# Patient Record
Sex: Male | Born: 1960 | Race: White | Hispanic: No | Marital: Married | Smoking: Never smoker
Health system: Southern US, Community
[De-identification: ages and names within clinical notes are randomized; demographics above are authoritative.]

## PROBLEM LIST (undated history)

## (undated) DIAGNOSIS — G4730 Sleep apnea, unspecified: Secondary | ICD-10-CM

## (undated) DIAGNOSIS — N419 Inflammatory disease of prostate, unspecified: Secondary | ICD-10-CM

## (undated) DIAGNOSIS — K635 Polyp of colon: Secondary | ICD-10-CM

## (undated) DIAGNOSIS — K219 Gastro-esophageal reflux disease without esophagitis: Secondary | ICD-10-CM

## (undated) DIAGNOSIS — M1990 Unspecified osteoarthritis, unspecified site: Secondary | ICD-10-CM

## (undated) DIAGNOSIS — G2581 Restless legs syndrome: Secondary | ICD-10-CM

## (undated) DIAGNOSIS — E079 Disorder of thyroid, unspecified: Secondary | ICD-10-CM

## (undated) HISTORY — DX: Unspecified osteoarthritis, unspecified site: M19.90

## (undated) HISTORY — DX: Gastro-esophageal reflux disease without esophagitis: K21.9

## (undated) HISTORY — DX: Sleep apnea, unspecified: G47.30

## (undated) HISTORY — DX: Polyp of colon: K63.5

## (undated) HISTORY — DX: Disorder of thyroid, unspecified: E07.9

## (undated) HISTORY — PX: CHOLECYSTECTOMY: SHX55

---

## 1999-11-04 ENCOUNTER — Other Ambulatory Visit: Admission: RE | Admit: 1999-11-04 | Discharge: 1999-11-04 | Payer: Self-pay | Admitting: *Deleted

## 2016-09-28 DIAGNOSIS — R079 Chest pain, unspecified: Secondary | ICD-10-CM | POA: Diagnosis not present

## 2016-09-28 DIAGNOSIS — G4733 Obstructive sleep apnea (adult) (pediatric): Secondary | ICD-10-CM | POA: Diagnosis not present

## 2016-09-28 DIAGNOSIS — I10 Essential (primary) hypertension: Secondary | ICD-10-CM | POA: Diagnosis not present

## 2016-09-28 DIAGNOSIS — G2581 Restless legs syndrome: Secondary | ICD-10-CM

## 2016-09-28 DIAGNOSIS — N400 Enlarged prostate without lower urinary tract symptoms: Secondary | ICD-10-CM

## 2016-09-29 DIAGNOSIS — I10 Essential (primary) hypertension: Secondary | ICD-10-CM | POA: Diagnosis not present

## 2016-09-29 DIAGNOSIS — G4733 Obstructive sleep apnea (adult) (pediatric): Secondary | ICD-10-CM | POA: Diagnosis not present

## 2016-09-29 DIAGNOSIS — R079 Chest pain, unspecified: Secondary | ICD-10-CM | POA: Diagnosis not present

## 2016-09-29 DIAGNOSIS — N400 Enlarged prostate without lower urinary tract symptoms: Secondary | ICD-10-CM | POA: Diagnosis not present

## 2018-03-02 ENCOUNTER — Emergency Department (HOSPITAL_BASED_OUTPATIENT_CLINIC_OR_DEPARTMENT_OTHER)
Admission: EM | Admit: 2018-03-02 | Discharge: 2018-03-02 | Disposition: A | Discharge status: Home / Self Care | Payer: BLUE CROSS/BLUE SHIELD | Attending: Emergency Medicine | Admitting: Emergency Medicine

## 2018-03-02 ENCOUNTER — Other Ambulatory Visit: Payer: Self-pay

## 2018-03-02 ENCOUNTER — Encounter (HOSPITAL_BASED_OUTPATIENT_CLINIC_OR_DEPARTMENT_OTHER): Payer: Self-pay

## 2018-03-02 DIAGNOSIS — N4822 Cellulitis of corpus cavernosum and penis: Secondary | ICD-10-CM

## 2018-03-02 DIAGNOSIS — Z79899 Other long term (current) drug therapy: Secondary | ICD-10-CM | POA: Insufficient documentation

## 2018-03-02 HISTORY — DX: Inflammatory disease of prostate, unspecified: N41.9

## 2018-03-02 HISTORY — DX: Restless legs syndrome: G25.81

## 2018-03-02 MED ORDER — CEPHALEXIN 500 MG PO CAPS: 500.0000 mg | ORAL_CAPSULE | Freq: Four times a day (QID) | ORAL | 0 refills | Status: AC

## 2018-03-02 NOTE — ED Triage Notes (Signed)
Pt c/o swelling to the tip of his penis, went to UC and sent here; denies discharge or urinary difficulty

## 2018-03-02 NOTE — ED Provider Notes (Signed)
[facility] Provider Note   CSN: [id number] Arrival date & time: 03/02/18  1707     History   Chief Complaint Chief Complaint  Patient presents with  . Groin Swelling    HPI Christopher [name] is a 57 y.o. male.  The history is provided by the patient and medical records.  Rash   This is a new problem. The current episode started 12 to 24 hours ago. The problem has not changed since onset.The problem is associated with nothing. There has been no fever. The rash is present on the genitalia. The pain is at a severity of 1/10. The pain is mild. The pain has been constant since onset. Associated symptoms include pain. He has tried nothing for the symptoms. The treatment provided no relief.    Past Medical History:  Diagnosis Date  . Prostatitis   . Restless leg     There are no active problems to display for this patient.   History reviewed. No pertinent surgical history.      Home Medications    Prior to Admission medications   Medication Sig Start Date End Date Taking? Authorizing Provider  gabapentin (NEURONTIN) 600 MG tablet Take 600 mg by mouth at bedtime.   Yes [provider]  tamsulosin (FLOMAX) 0.4 MG CAPS capsule Take 0.4 mg by mouth.   Yes [provider]    Family History No family history on file.  Social History Social History   Tobacco Use  . Smoking status: Never Smoker  . Smokeless tobacco: Never Used  Substance Use Topics  . Alcohol use: Yes  . Drug use: Not Currently     Allergies   Patient has no known allergies.   Review of Systems Review of Systems  Constitutional: Negative for activity change, chills, diaphoresis, fatigue and fever.  HENT: Negative for congestion and rhinorrhea.   Eyes: Negative for visual disturbance.  Respiratory: Negative for cough, chest tightness, shortness of breath, wheezing and stridor.   Cardiovascular: Negative for chest pain, palpitations and leg  swelling.  Gastrointestinal: Negative for abdominal distention, abdominal pain, blood in stool, constipation, diarrhea, nausea and vomiting.  Genitourinary: Positive for penile pain and penile swelling. Negative for decreased urine volume, difficulty urinating, discharge, dysuria, flank pain, frequency, hematuria, scrotal swelling, testicular pain and urgency.  Musculoskeletal: Negative for back pain and gait problem.  Skin: Positive for rash. Negative for wound.  Neurological: Negative for dizziness, weakness, light-headedness and headaches.  Psychiatric/Behavioral: Negative for agitation.  All other systems reviewed and are negative.    Physical Exam Updated Vital Signs BP 140/81 (BP Location: Right Arm)   Pulse 96   Temp 98.5 F (36.9 C) (Oral)   Resp 18   Ht 6' (1.829 m)   Wt 112 kg (247 lb)   SpO2 97%   BMI 33.50 kg/m   Physical Exam  Constitutional: He is oriented to person, place, and time. He appears well-developed and well-nourished. No distress.  HENT:  Head: Normocephalic and atraumatic.  Eyes: Conjunctivae are normal.  Neck: Neck supple.  Cardiovascular: Normal rate.  Pulmonary/Chest: Effort normal.  Abdominal: Soft. There is no tenderness. Hernia confirmed negative in the right inguinal area and confirmed negative in the left inguinal area.  Genitourinary: Testes normal. Right testis shows no tenderness. Right testis is descended. Left testis is descended. Circumcised. Penile erythema and penile tenderness present. No phimosis or paraphimosis. No discharge found.     Musculoskeletal: He exhibits no edema or deformity.  Lymphadenopathy: No inguinal adenopathy noted on the right or left side.  Neurological: He is alert and oriented to person, place, and time. No sensory deficit.  Skin: Skin is warm and dry. Capillary refill takes less than 2 seconds. Rash noted. He is not diaphoretic. There is erythema (groin). No pallor.  Psychiatric: He has a normal mood and  affect.  Nursing note and vitals reviewed.    ED Treatments / Results  Labs (all labs ordered are listed, but only abnormal results are displayed) Labs Reviewed - No data to display  EKG None  Radiology No results found.  Procedures Procedures (including critical care time)  Medications Ordered in ED Medications - No data to display   Initial Impression / Assessment and Plan / ED Course  I have reviewed the triage vital signs and the nursing notes.  Pertinent labs & imaging results that were available during my care of the patient were reviewed by me and considered in my medical decision making (see chart for details).     Christopher [name] is a 57 y.o. male with no significant past medical history who presents with penile swelling.  Patient reports that he woke up this morning with some swelling redness and mild tenderness at the distal penis but proximal to the glans.  He denies any dysuria, hematuria or difficulty with urination.  He reports no sexual injuries and has had no cuts or scrapes on his penis.  He denies any history of this.  He said remotely had a history of prostatitis but said he did not have any similar symptoms.  He denies any hernias, testicle pain or testicle injuries.  He denies any fevers, chills, rhinorrhea, congestion, cough, urinary symptoms or GI symptoms.  He denies other complaints.  Next  Patient reports using an urgent care and was sent to the emergency department for evaluation.  He reports that he is never had this before.  On exam, patient had very mild swelling at his distal penis just proximal to the glans.  He is circumcised.  There was some mild tenderness and erythema at this area  No ulcers or vesicles were seen.  No palpable areas of lymph nodes.  No testicle tenderness and patient had normal cremasteric bilaterally.  Testicles nontender and exam otherwise unremarkable.  Suspect penile cellulitis.  Given patient's lack of urinary symptoms  and no testicle tenderness or symptoms, do not feel patient needs ultrasound at this time.  There was not a palpable fluctuant area that needed drainage however we do want to cover with antibiotics.  Patient was started on Keflex and will follow up with a PCP and urologist for further management.  Do not feel patient has balanitis given lack of any abnormalities or tenderness on the glans.  Patient denies history of STI and denied any other complaints.  Next  Patient understood plan of care as well as return precautions and follow-up instructions.  Patient had no other questions or concerns and was discharged in good condition.   Final Clinical Impressions(s) / ED Diagnoses   Final diagnoses:  Penile cellulitis    ED Discharge Orders        Ordered    cephALEXin (KEFLEX) 500 MG capsule  4 times daily     03/02/18 1935      Clinical Impression: 1. Penile cellulitis     Disposition: Discharge  Condition: Good  I have discussed the results, Dx and Tx plan with the pt(& family if present). He/she/they expressed understanding  and agree(s) with the plan. Discharge instructions discussed at great length. Strict return precautions discussed and pt &/or family have verbalized understanding of the instructions. No further questions at time of discharge.    New Prescriptions   CEPHALEXIN (KEFLEX) 500 MG CAPSULE    Take 1 capsule (500 mg total) by mouth 4 (four) times daily for 7 days.    Follow Up: Festus Aloe, MD [place] [address] [phone]     [place] [id number] [phone] Schedule an appointment as soon as possible for a visit       Yehya Brendle, Gwenyth Allegra, MD 03/03/18 [phone]

## 2018-03-02 NOTE — ED Notes (Signed)
Pt cont await md eval.

## 2018-03-02 NOTE — Discharge Instructions (Signed)
Your exam today was consistent with a penile cellulitis.  It did not appear to involve the tip called a balanitis.  As you have no other symptoms and had no tenderness in the groin, scrotum, testicles, or other areas, we do not feel he needed further laboratory testing or imaging/ultrasound today.  We do want to treat you with antibiotics for this infection and have a follow-up with a PCP and urology.  If any symptoms change or worsen or he began having difficulty urinating, please return to the nearest emergency department.

## 2018-07-19 ENCOUNTER — Emergency Department (HOSPITAL_COMMUNITY): Payer: BLUE CROSS/BLUE SHIELD

## 2018-07-19 ENCOUNTER — Encounter (HOSPITAL_COMMUNITY): Payer: Self-pay

## 2018-07-19 ENCOUNTER — Emergency Department (HOSPITAL_COMMUNITY)
Admission: EM | Admit: 2018-07-19 | Discharge: 2018-07-19 | Disposition: A | Discharge status: Home / Self Care | Payer: BLUE CROSS/BLUE SHIELD | Attending: Emergency Medicine | Admitting: Emergency Medicine

## 2018-07-19 DIAGNOSIS — Z79899 Other long term (current) drug therapy: Secondary | ICD-10-CM | POA: Diagnosis not present

## 2018-07-19 DIAGNOSIS — S37012A Minor contusion of left kidney, initial encounter: Secondary | ICD-10-CM | POA: Diagnosis not present

## 2018-07-19 DIAGNOSIS — Y939 Activity, unspecified: Secondary | ICD-10-CM | POA: Insufficient documentation

## 2018-07-19 DIAGNOSIS — T07XXXA Unspecified multiple injuries, initial encounter: Secondary | ICD-10-CM

## 2018-07-19 DIAGNOSIS — F17220 Nicotine dependence, chewing tobacco, uncomplicated: Secondary | ICD-10-CM | POA: Diagnosis not present

## 2018-07-19 DIAGNOSIS — S2232XA Fracture of one rib, left side, initial encounter for closed fracture: Secondary | ICD-10-CM | POA: Diagnosis not present

## 2018-07-19 DIAGNOSIS — Y998 Other external cause status: Secondary | ICD-10-CM | POA: Insufficient documentation

## 2018-07-19 DIAGNOSIS — Y92410 Unspecified street and highway as the place of occurrence of the external cause: Secondary | ICD-10-CM | POA: Insufficient documentation

## 2018-07-19 DIAGNOSIS — S0081XA Abrasion of other part of head, initial encounter: Secondary | ICD-10-CM | POA: Diagnosis present

## 2018-07-19 LAB — COMPREHENSIVE METABOLIC PANEL
ALBUMIN: 4 g/dL (ref 3.5–5.0)
ALT: 41 U/L (ref 0–44)
AST: 34 U/L (ref 15–41)
Alkaline Phosphatase: 61 U/L (ref 38–126)
Anion gap: 10 (ref 5–15)
BILIRUBIN TOTAL: 1.2 mg/dL (ref 0.3–1.2)
BUN: 16 mg/dL (ref 6–20)
CHLORIDE: 102 mmol/L (ref 98–111)
CO2: 24 mmol/L (ref 22–32)
CREATININE: 1.1 mg/dL (ref 0.61–1.24)
Calcium: 9 mg/dL (ref 8.9–10.3)
GFR calc Af Amer: >60 mL/min (ref >60)
GLUCOSE: 136 mg/dL — AB (ref 70–99)
POTASSIUM: 4.1 mmol/L (ref 3.5–5.1)
Sodium: 136 mmol/L (ref 135–145)
Total Protein: 6.9 g/dL (ref 6.5–8.1)

## 2018-07-19 LAB — CBC WITH DIFFERENTIAL/PLATELET
ABS IMMATURE GRANULOCYTES: 0.07 10*3/uL (ref 0.00–0.07)
BASOS ABS: 0 10*3/uL (ref 0.0–0.1)
Basophils Relative: 0 %
Eosinophils Absolute: 0.2 10*3/uL (ref 0.0–0.5)
Eosinophils Relative: 2 %
HEMATOCRIT: 50.3 % (ref 39.0–52.0)
Hemoglobin: 16.8 g/dL (ref 13.0–17.0)
Immature Granulocytes: 1 %
LYMPHS ABS: 1.4 10*3/uL (ref 0.7–4.0)
Lymphocytes Relative: 12 %
MCH: 28.8 pg (ref 26.0–34.0)
MCHC: 33.4 g/dL (ref 30.0–36.0)
MCV: 86.3 fL (ref 80.0–100.0)
MONOS PCT: 6 %
Monocytes Absolute: 0.7 10*3/uL (ref 0.1–1.0)
NEUTROS ABS: 9 10*3/uL — AB (ref 1.7–7.7)
NRBC: 0 % (ref 0.0–0.2)
Neutrophils Relative %: 79 %
PLATELETS: 173 10*3/uL (ref 150–400)
RBC: 5.83 MIL/uL — ABNORMAL HIGH (ref 4.22–5.81)
RDW: 13 % (ref 11.5–15.5)
WBC: 11.3 10*3/uL — ABNORMAL HIGH (ref 4.0–10.5)

## 2018-07-19 LAB — URINALYSIS, ROUTINE W REFLEX MICROSCOPIC
BACTERIA UA: NONE SEEN
Bilirubin Urine: NEGATIVE
Glucose, UA: NEGATIVE mg/dL
Ketones, ur: NEGATIVE mg/dL
Leukocytes, UA: NEGATIVE
Nitrite: NEGATIVE
PROTEIN: 30 mg/dL — AB
SPECIFIC GRAVITY, URINE: 1.036 — AB (ref 1.005–1.030)
pH: 5 (ref 5.0–8.0)
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
## 2018-07-19 MED ORDER — ACETAMINOPHEN 500 MG PO TABS: 1000.0000 mg | ORAL_TABLET | Freq: Three times a day (TID) | ORAL | 0 refills | Status: AC

## 2018-07-19 MED ORDER — OXYCODONE-ACETAMINOPHEN 5-325 MG PO TABS: 1.0000 | ORAL_TABLET | Freq: Three times a day (TID) | ORAL | 0 refills | Status: AC | PRN

## 2018-07-19 MED ORDER — IOHEXOL 300 MG/ML  SOLN
100.0000 mL | Freq: Once | INTRAMUSCULAR | Status: AC | PRN
  Administered 2018-07-19: 100 mL via INTRAVENOUS

## 2018-07-19 MED ORDER — HYDROMORPHONE HCL 1 MG/ML IJ SOLN
1.0000 mg | Freq: Once | INTRAMUSCULAR | Status: AC
  Administered 2018-07-19: 1 mg via INTRAVENOUS
  Filled 2018-07-19: qty 1

## 2018-07-19 MED ORDER — OXYCODONE-ACETAMINOPHEN 5-325 MG PO TABS
1.0000 | ORAL_TABLET | Freq: Once | ORAL | Status: AC
  Administered 2018-07-19: 1 via ORAL
  Filled 2018-07-19: qty 1

## 2018-07-19 MED ORDER — SODIUM CHLORIDE 0.9 % IV BOLUS
1000.0000 mL | Freq: Once | INTRAVENOUS | Status: AC
  Administered 2018-07-19: 1000 mL via INTRAVENOUS

## 2018-07-19 MED ORDER — HYDROMORPHONE HCL 1 MG/ML IJ SOLN
1.0000 mg | Freq: Once | INTRAMUSCULAR | Status: AC
Start: 2018-07-19 — End: 2018-07-19
  Administered 2018-07-19: 1 mg via INTRAVENOUS
  Filled 2018-07-19: qty 1
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
## 2018-07-19 NOTE — ED Notes (Signed)
Patient transported to X-ray and then CT 

## 2018-07-19 NOTE — Discharge Instructions (Addendum)
If you develop severe abdominal pain, intractable nausea and vomiting, or persistent lightheadedness please return to the emergency department for immediate evaluation.  Please use your incentive spirometer frequently throughout the day (at least 5-10 times a day) for the next 2 to 3 weeks.  This will help prevent the development of pneumonia.  Refrain from taking any ibuprofen or aspirin type medication as this may put you at risk for bleeding. Refrain from drinking alcohol for at least 7-10 days.  Use Tylenol and the prescribed narcotic medication for pain.
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
## 2019-08-30 DIAGNOSIS — C091 Malignant neoplasm of tonsillar pillar (anterior) (posterior): Secondary | ICD-10-CM | POA: Diagnosis not present

## 2019-09-11 DIAGNOSIS — C099 Malignant neoplasm of tonsil, unspecified: Secondary | ICD-10-CM | POA: Diagnosis present

## 2019-09-11 HISTORY — DX: Malignant neoplasm of tonsil, unspecified: C09.9

## 2019-09-19 DIAGNOSIS — C091 Malignant neoplasm of tonsillar pillar (anterior) (posterior): Secondary | ICD-10-CM

## 2019-10-29 DIAGNOSIS — C091 Malignant neoplasm of tonsillar pillar (anterior) (posterior): Secondary | ICD-10-CM | POA: Diagnosis not present

## 2019-11-12 DIAGNOSIS — C091 Malignant neoplasm of tonsillar pillar (anterior) (posterior): Secondary | ICD-10-CM | POA: Diagnosis not present

## 2019-12-03 DIAGNOSIS — C091 Malignant neoplasm of tonsillar pillar (anterior) (posterior): Secondary | ICD-10-CM | POA: Diagnosis not present

## 2019-12-27 DIAGNOSIS — C091 Malignant neoplasm of tonsillar pillar (anterior) (posterior): Secondary | ICD-10-CM

## 2020-02-22 DIAGNOSIS — C091 Malignant neoplasm of tonsillar pillar (anterior) (posterior): Secondary | ICD-10-CM

## 2022-01-28 LAB — HM COLONOSCOPY

## 2022-02-26 ENCOUNTER — Emergency Department (HOSPITAL_COMMUNITY): Payer: 59

## 2022-02-26 ENCOUNTER — Inpatient Hospital Stay (HOSPITAL_COMMUNITY): Payer: 59

## 2022-02-26 ENCOUNTER — Encounter (HOSPITAL_COMMUNITY): Payer: Self-pay | Admitting: Emergency Medicine

## 2022-02-26 ENCOUNTER — Inpatient Hospital Stay (HOSPITAL_COMMUNITY)
Admission: EM | Admit: 2022-02-26 | Discharge: 2022-03-02 | DRG: 479 | Disposition: A | Discharge status: Home / Self Care | Payer: 59 | Attending: Internal Medicine | Admitting: Internal Medicine

## 2022-02-26 ENCOUNTER — Other Ambulatory Visit: Payer: Self-pay

## 2022-02-26 DIAGNOSIS — Z9049 Acquired absence of other specified parts of digestive tract: Secondary | ICD-10-CM

## 2022-02-26 DIAGNOSIS — M4304 Spondylolysis, thoracic region: Secondary | ICD-10-CM | POA: Diagnosis present

## 2022-02-26 DIAGNOSIS — R1084 Generalized abdominal pain: Secondary | ICD-10-CM | POA: Diagnosis not present

## 2022-02-26 DIAGNOSIS — Z811 Family history of alcohol abuse and dependence: Secondary | ICD-10-CM

## 2022-02-26 DIAGNOSIS — C099 Malignant neoplasm of tonsil, unspecified: Secondary | ICD-10-CM | POA: Diagnosis present

## 2022-02-26 DIAGNOSIS — R1011 Right upper quadrant pain: Secondary | ICD-10-CM | POA: Diagnosis not present

## 2022-02-26 DIAGNOSIS — K089 Disorder of teeth and supporting structures, unspecified: Secondary | ICD-10-CM | POA: Diagnosis not present

## 2022-02-26 DIAGNOSIS — N419 Inflammatory disease of prostate, unspecified: Secondary | ICD-10-CM | POA: Diagnosis present

## 2022-02-26 DIAGNOSIS — Z9221 Personal history of antineoplastic chemotherapy: Secondary | ICD-10-CM | POA: Diagnosis not present

## 2022-02-26 DIAGNOSIS — I10 Essential (primary) hypertension: Secondary | ICD-10-CM | POA: Diagnosis present

## 2022-02-26 DIAGNOSIS — F17290 Nicotine dependence, other tobacco product, uncomplicated: Secondary | ICD-10-CM | POA: Diagnosis present

## 2022-02-26 DIAGNOSIS — M4644 Discitis, unspecified, thoracic region: Secondary | ICD-10-CM | POA: Diagnosis present

## 2022-02-26 DIAGNOSIS — G8929 Other chronic pain: Secondary | ICD-10-CM | POA: Diagnosis present

## 2022-02-26 DIAGNOSIS — G2581 Restless legs syndrome: Secondary | ICD-10-CM | POA: Diagnosis present

## 2022-02-26 DIAGNOSIS — M4640 Discitis, unspecified, site unspecified: Principal | ICD-10-CM | POA: Insufficient documentation

## 2022-02-26 DIAGNOSIS — Z923 Personal history of irradiation: Secondary | ICD-10-CM | POA: Diagnosis not present

## 2022-02-26 DIAGNOSIS — M549 Dorsalgia, unspecified: Secondary | ICD-10-CM | POA: Diagnosis present

## 2022-02-26 DIAGNOSIS — M546 Pain in thoracic spine: Secondary | ICD-10-CM | POA: Diagnosis present

## 2022-02-26 DIAGNOSIS — M545 Low back pain, unspecified: Secondary | ICD-10-CM | POA: Diagnosis not present

## 2022-02-26 DIAGNOSIS — K5900 Constipation, unspecified: Secondary | ICD-10-CM | POA: Diagnosis present

## 2022-02-26 DIAGNOSIS — Z85818 Personal history of malignant neoplasm of other sites of lip, oral cavity, and pharynx: Secondary | ICD-10-CM | POA: Diagnosis not present

## 2022-02-26 DIAGNOSIS — R109 Unspecified abdominal pain: Secondary | ICD-10-CM | POA: Diagnosis present

## 2022-02-26 DIAGNOSIS — Z79899 Other long term (current) drug therapy: Secondary | ICD-10-CM

## 2022-02-26 LAB — COMPREHENSIVE METABOLIC PANEL
ALT: 24 U/L (ref 0–44)
AST: 17 U/L (ref 15–41)
Albumin: 3.6 g/dL (ref 3.5–5.0)
Alkaline Phosphatase: 72 U/L (ref 38–126)
Anion gap: 9 (ref 5–15)
BUN: 20 mg/dL (ref 6–20)
CO2: 22 mmol/L (ref 22–32)
Calcium: 9.5 mg/dL (ref 8.9–10.3)
Chloride: 105 mmol/L (ref 98–111)
Creatinine, Ser: 1.05 mg/dL (ref 0.61–1.24)
GFR, Estimated: >60 mL/min (ref >60)
Glucose, Bld: 117 mg/dL — ABNORMAL HIGH (ref 70–99)
Potassium: 3.9 mmol/L (ref 3.5–5.1)
Sodium: 136 mmol/L (ref 135–145)
Total Bilirubin: 1.4 mg/dL — ABNORMAL HIGH (ref 0.3–1.2)
Total Protein: 7.2 g/dL (ref 6.5–8.1)

## 2022-02-26 LAB — URINALYSIS, ROUTINE W REFLEX MICROSCOPIC
Bacteria, UA: NONE SEEN
Bilirubin Urine: NEGATIVE
Glucose, UA: NEGATIVE mg/dL
Ketones, ur: 20 mg/dL — AB
Leukocytes,Ua: NEGATIVE
Nitrite: NEGATIVE
Protein, ur: NEGATIVE mg/dL
Specific Gravity, Urine: 1.017 (ref 1.005–1.030)
pH: 5 (ref 5.0–8.0)

## 2022-02-26 LAB — HIV ANTIBODY (ROUTINE TESTING W REFLEX): HIV Screen 4th Generation wRfx: NONREACTIVE

## 2022-02-26 LAB — LIPASE, BLOOD: Lipase: 23 U/L (ref 11–51)

## 2022-02-26 LAB — CBC
HCT: 38.8 % — ABNORMAL LOW (ref 39.0–52.0)
Hemoglobin: 13.5 g/dL (ref 13.0–17.0)
MCH: 29.7 pg (ref 26.0–34.0)
MCHC: 34.8 g/dL (ref 30.0–36.0)
MCV: 85.3 fL (ref 80.0–100.0)
Platelets: 246 10*3/uL (ref 150–400)
RBC: 4.55 MIL/uL (ref 4.22–5.81)
RDW: 12.4 % (ref 11.5–15.5)
WBC: 8.2 10*3/uL (ref 4.0–10.5)
nRBC: 0 % (ref 0.0–0.2)

## 2022-02-26 LAB — SEDIMENTATION RATE: Sed Rate: 77 mm/hr — ABNORMAL HIGH (ref 0–16)

## 2022-02-26 LAB — C-REACTIVE PROTEIN: CRP: 5.4 mg/dL — ABNORMAL HIGH (ref <1.0)

## 2022-02-26 IMAGING — MR MR THORACIC SPINE WO/W CM
6 of 13 series · 16 of 48 positions shown · IV contrast (Gadavist)
Comparison: CTA abdomen and pelvis [K3] hours today.

CLINICAL DATA: 60-year-old male with mid back pain since last
month. Anterior endplate erosions at T10-T11 on CTA suspicious for
[K3].

EXAM:
MRI THORACIC WITHOUT AND WITH CONTRAST
TECHNIQUE: Multiplanar and multiecho pulse sequences of the thoracic spine were
obtained without and with intravenous contrast.
CONTRAST:  10mL GADAVIST GADOBUTROL 1 MMOL/ML IV SOLN

[Series 25: T1 · sagittal · 3.0mm · 0.62mm/px · 2 of 14 slices shown (1 of 4)]
[im 1/14]
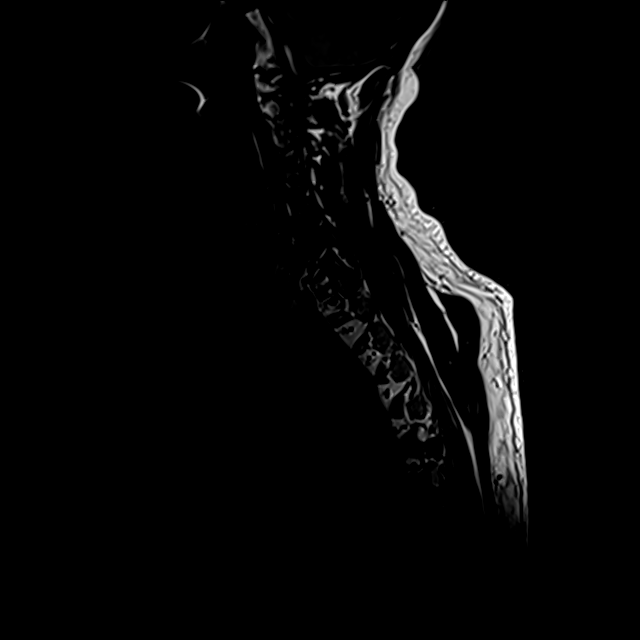
[im 14/14]
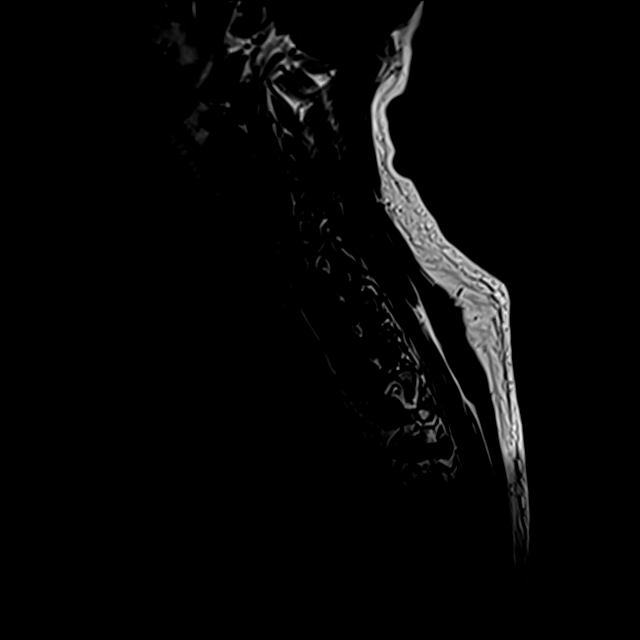

[Series 26: T1 · sagittal · 3.3mm · 0.62mm/px · 2 of 14 slices shown (2 of 4)]
[im 1/14]
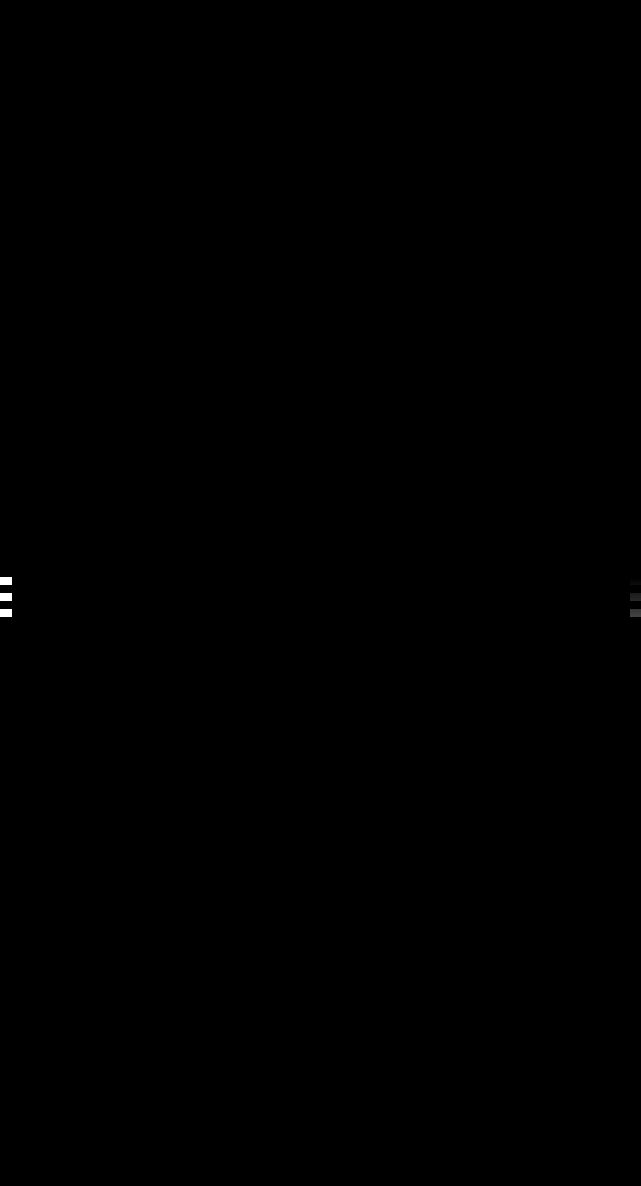
[im 14/14]
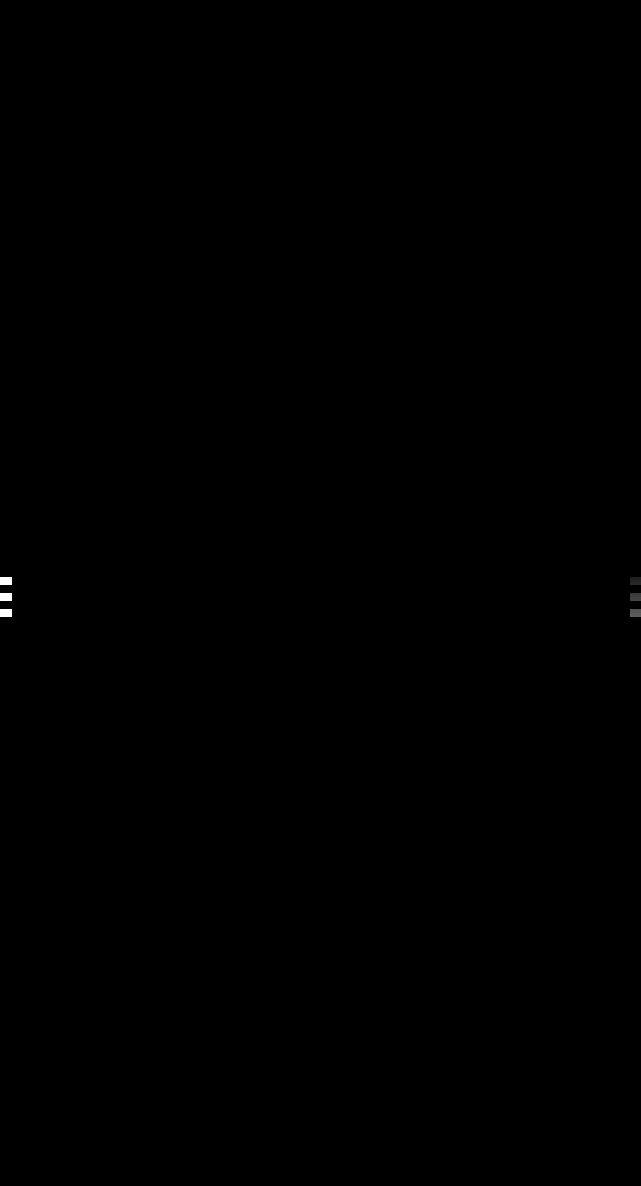

[Series 27: T2 · sagittal · 3.0mm · 0.87mm/px · 3 of 23 slices shown (1 of 2)]
[im 1/23]
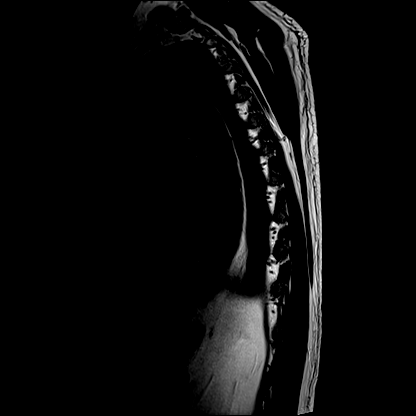
[im 12/23]
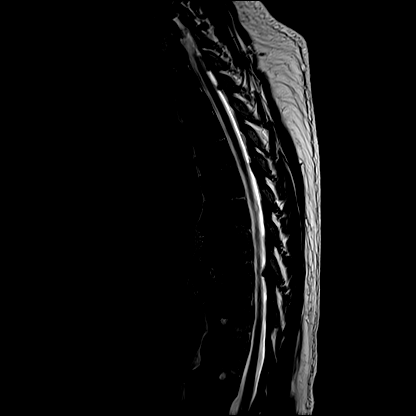
[im 23/23]
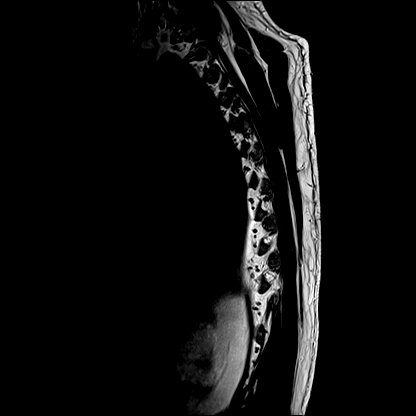

[Series 28: T1 · sagittal · 3.0mm · 0.87mm/px · 3 of 23 slices shown (3 of 4)]
[im 1/23]
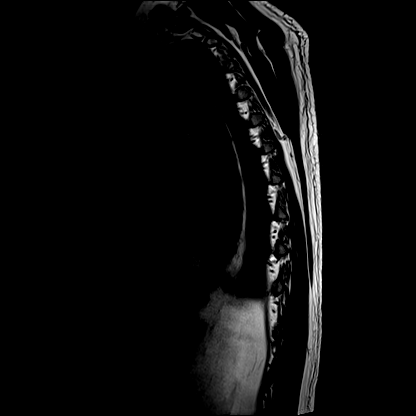
[im 12/23]
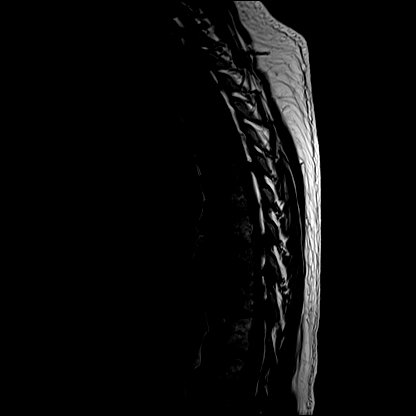
[im 23/23]
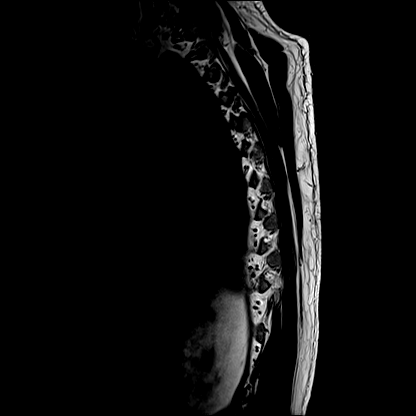

[Series 30: T2 · axial · 5.0mm · 0.59mm/px · z∈[-193,+85]mm · 4 of 39 slices shown (2 of 2)]
[im 1/39]
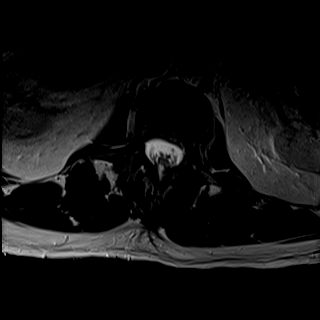
[im 13/39]
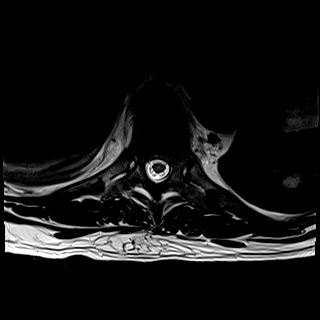
[im 26/39]
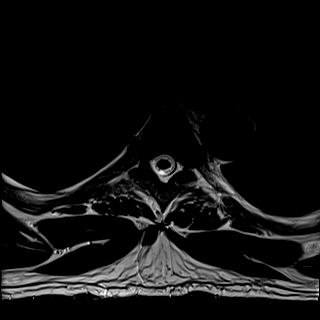
[im 39/39]
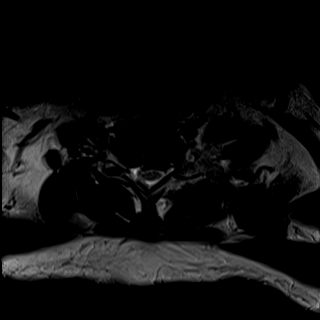

[Series 32: T1 · axial · non-contrast · 5.0mm · 0.31mm/px · z∈[-193,-93]mm · 2 of 39 slices shown (4 of 4)]
[im 1/39]
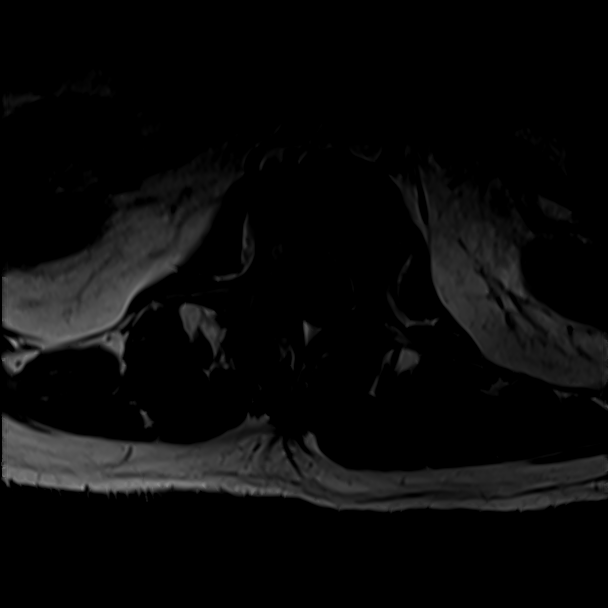
[im 13/39]
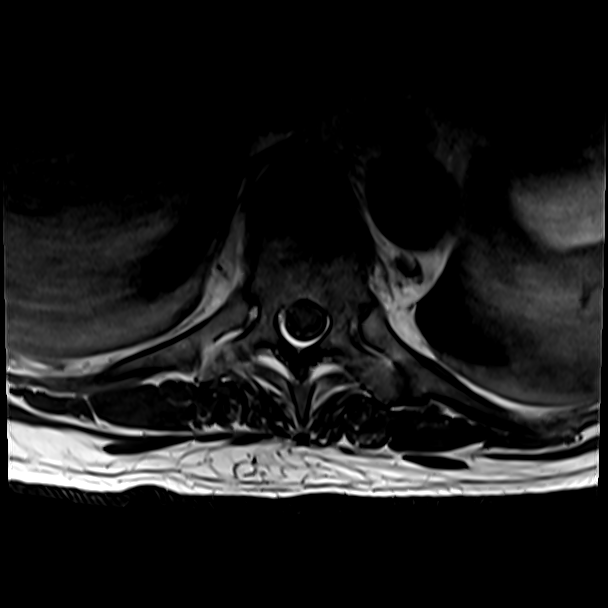

[16 of 48 positions shown; findings below may reference images not displayed]

CT Abdomen and
Pelvis [DATE].

Neck CT [DATE].

Thoracic spine radiographs [DATE].
FINDINGS: Limited cervical spine imaging: Chronic C5-C6 disc and endplate
degeneration grossly stable since [K3].

Thoracic spine segmentation: Evidence of hypoplastic or absent ribs
at T12.

Alignment: Stable thoracic kyphosis from the radiographs last month.

Vertebrae: Confluent abnormal marrow edema and enhancement in the T9
and T10 vertebral bodies, especially anteriorly where CTA this
morning demonstrates bulky anterior endplate osteophytes have been
partially eroded since the CT on [DATE] (series 34 image 13 of
this exam). There is associated prevertebral and bilateral
paraspinal soft tissue thickening and enhancement (series 31, image
30), however, the intervening T9-T10 disc space seems to remain
relatively normal. No posterior paraspinal soft tissue inflammation.
No discrete fluid collection. And the epidural space here also seems
to remain normal.

There is also faint marrow edema and enhancement in the right
superior T12 body on series 29, image 8, and perhaps early T11
anterior inferior endplate edema also. But no other paraspinal soft
tissue abnormality. Anterior endplate osteophytes at T7 and T8
appear to remain normal. Background bone marrow signal is normal.
Benign posterior element hemangioma on the left at T5.

Cord: Normal. Conus medullaris mostly visible at T12-L1. No abnormal
intradural enhancement or dural thickening.

Paraspinal and other soft tissues: Abnormal ventral and lateral
paraspinal soft tissues at T9-T10 detailed above.

Negative elsewhere. Visible chest remarkable FEONA 4 trace layering
right pleural effusion and mild nonspecific dependent lung base
opacity. Grossly negative visible mediastinum and upper abdominal
viscera.

Disc levels:

No age advanced thoracic disc degeneration. Intermittent thoracic
posterior element hypertrophy, including at the T9-T10 level greater
on the left (series 30, image 28). But no significant thoracic
spinal stenosis. No significant foraminal stenosis.
IMPRESSION: 1. Transitional spinal anatomy. Hypoplastic ribs at T12. When
counting from the skull base the abnormal anterior thoracic spinal
segment is T9-T10.

2. Confluent abnormal edema and enhancement of the anterior T9-T10
bodies where partial erosion of anterior endplate osteophytes has
occurred by CT since [DATE].
Bulky associated ventral and lateral paraspinal soft tissue
thickening and enhancement, however, the intervening T9-T10 disc
remains relatively normal. Epidural space remains normal. And there
is no abscess or fluid collection.
Questionable early abnormal T12 vertebral body edema and
enhancement.
Therefore, differential considerations include:
- unusual Osteomyelitis with little to no associated discitis.
Recommend blood cultures.
- lytic Tumor or Metastasis with extraosseous extension.

3. Mild for age thoracic spine degeneration (posterior element
hypertrophy) with no spinal or foraminal stenosis.

4. Trace right pleural effusion.

## 2022-02-26 IMAGING — CT CT L SPINE W/O CM
3 series · 12 of 34 positions shown, 14 images · IV contrast (APPLIED)
Comparison: CT of abdomen and pelvis no contrast [DATE], CT
chest with contrast [DATE].

CLINICAL DATA: Back pain.



[Series 8: l-spine soft ax · axial · 0.30mm/px · z∈[-533,-377]mm · 4 of 114 slices shown, 5 images]
[im 18/114  soft-tissue]
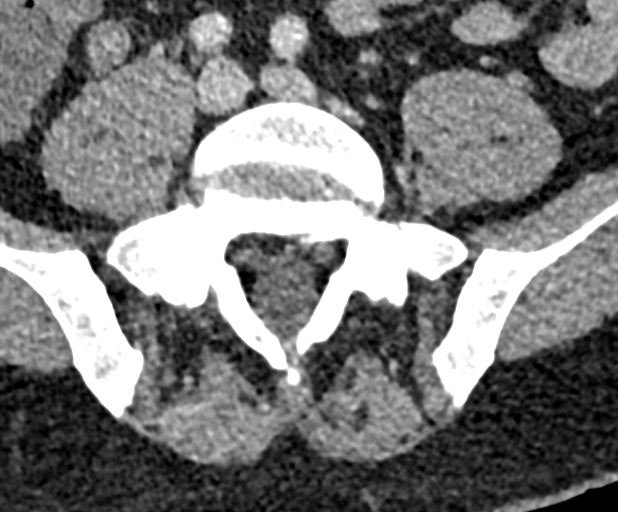
[im 18/114  bone]
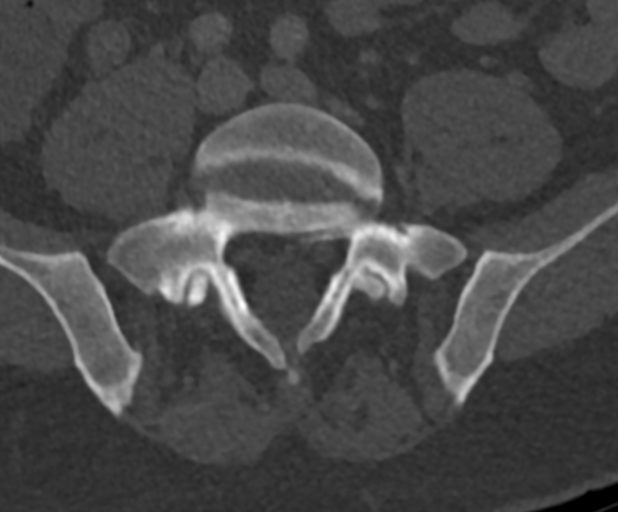
[im 44/114  bone]
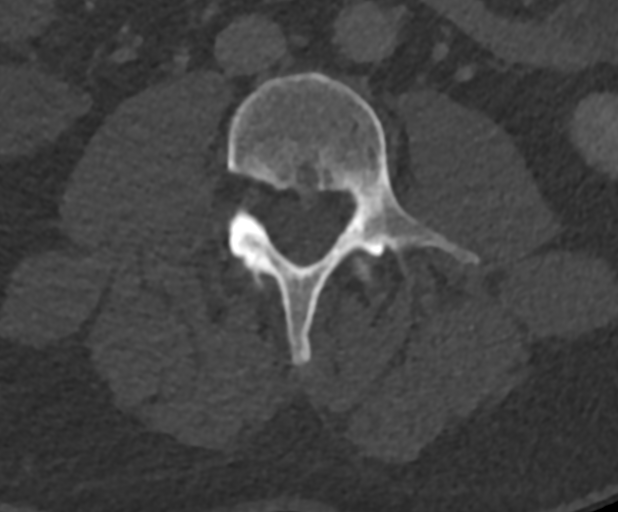
[im 70/114  bone]
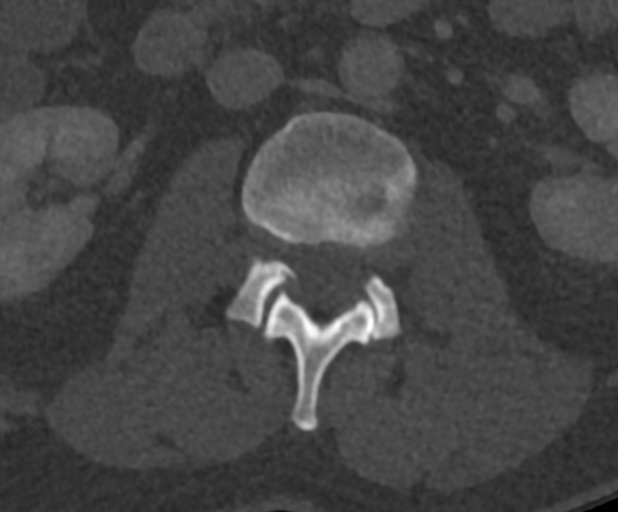
[im 96/114  bone]
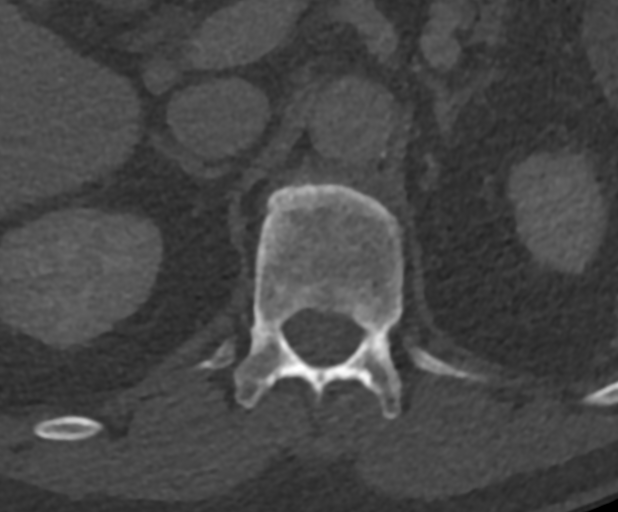

[Series 9: l-spine soft cor · coronal · 0.36mm/px · 3 of 78 slices shown]
[im 16/78  bone]
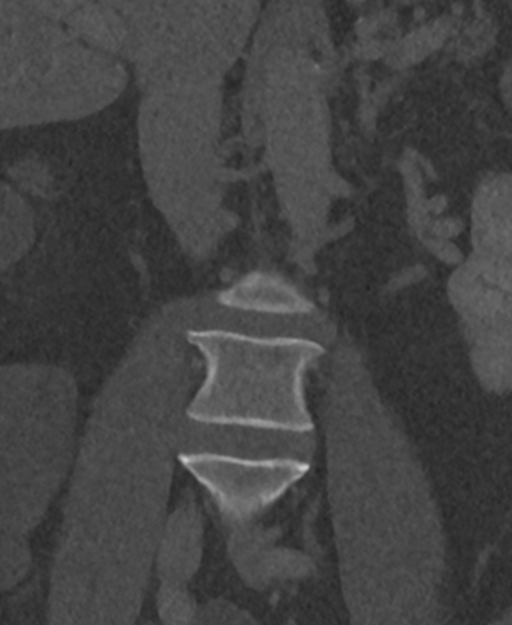
[im 31/78  bone]
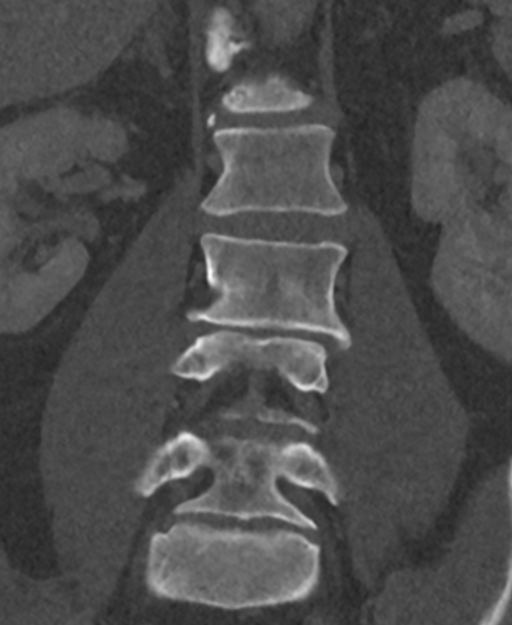
[im 47/78  bone]
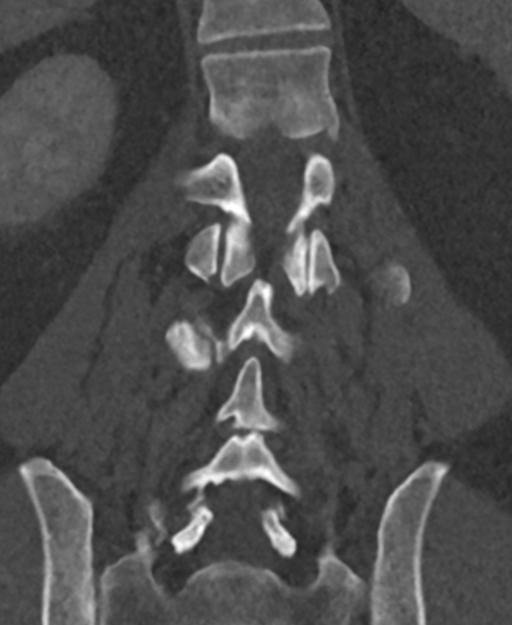

[Series 10: l-spine soft sag · sagittal · 0.30mm/px · 5 of 88 slices shown, 6 images]
[im 30/88  bone]
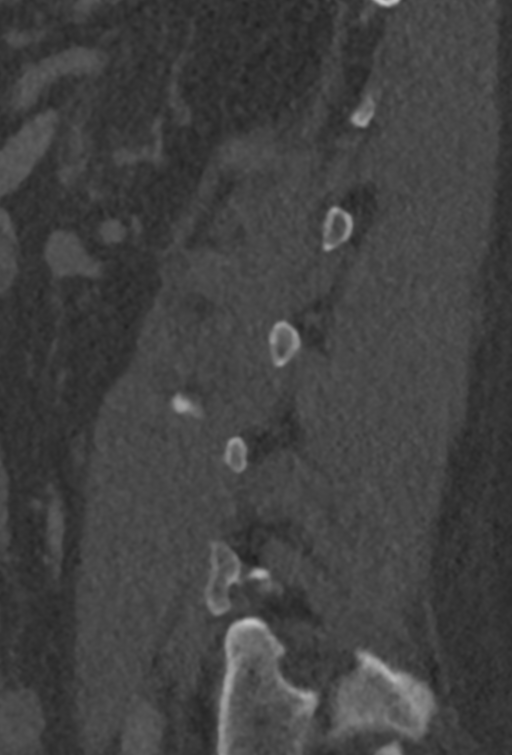
[im 37/88  bone]
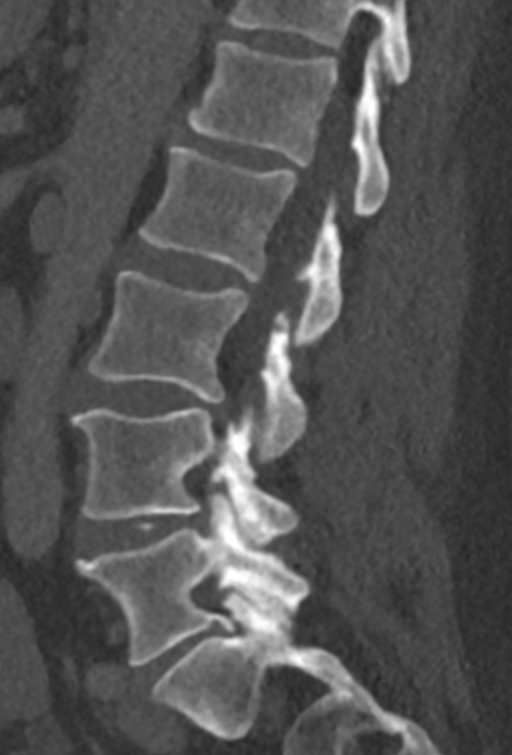
[im 44/88  soft-tissue]
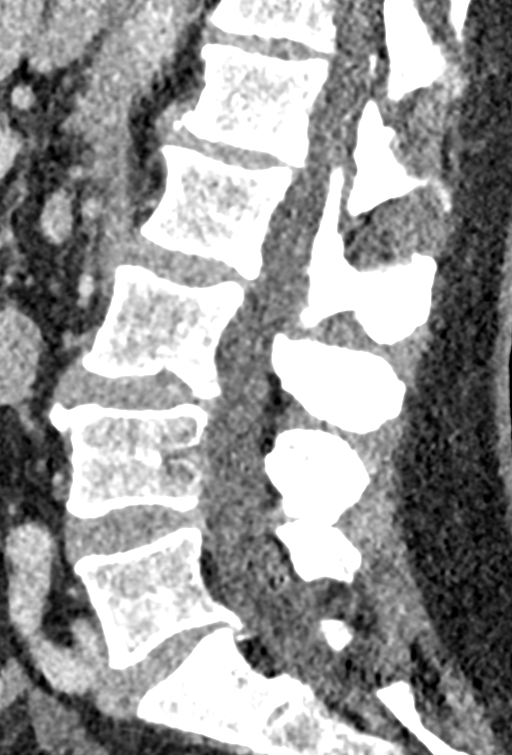
[im 44/88  bone]
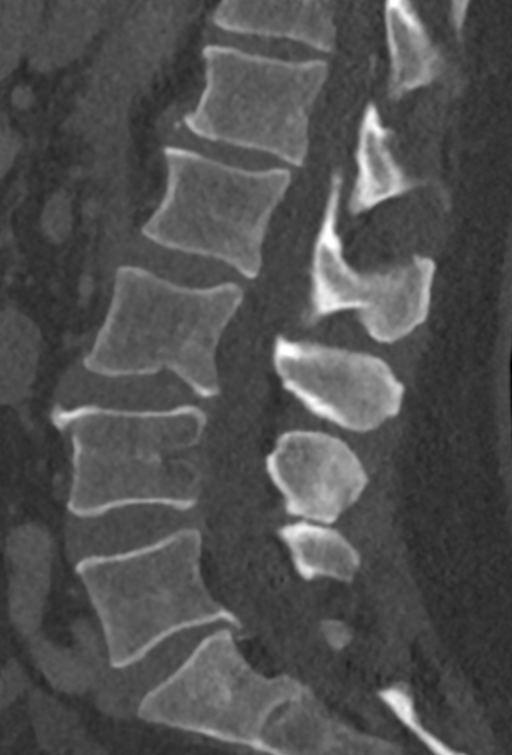
[im 51/88  bone]
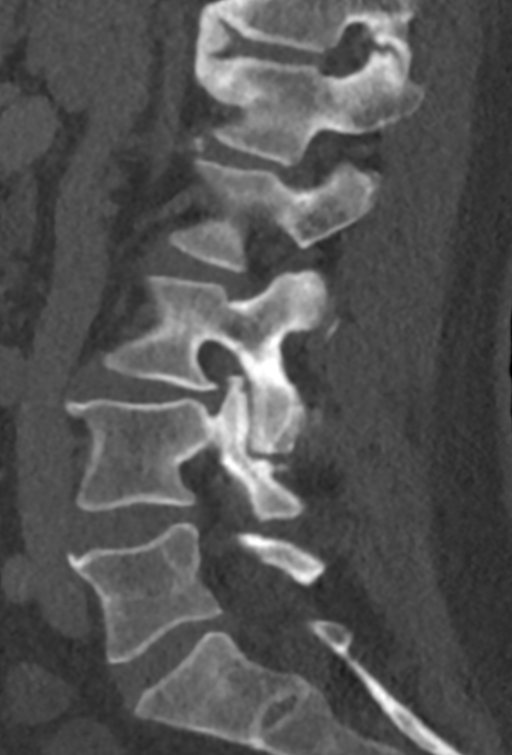
[im 59/88  bone]
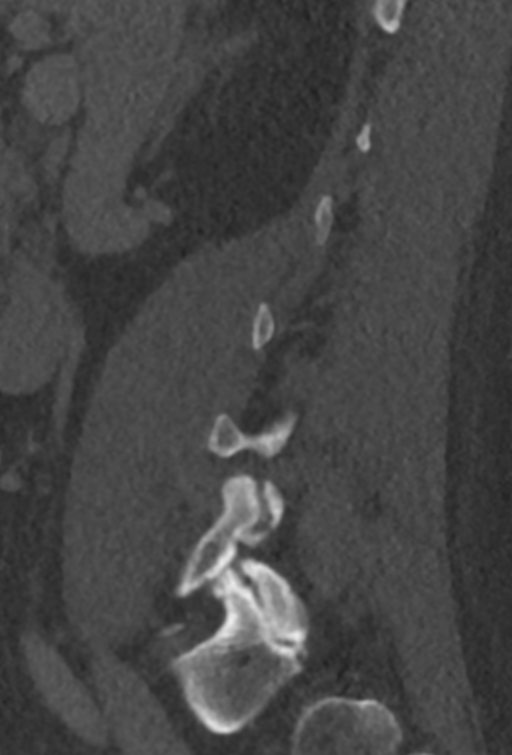

[12 of 34 positions shown; findings below may reference images not displayed]

FINDINGS: Segmentation: There are hypoplastic T12 ribs (confirmed on the prior
chest CT) and only 4 non-rib-bearing lumbar segments. There is no
transitional segment.

Alignment: There is mild levoscoliosis. Apex is at L1-2. There is no
AP listhesis.

Vertebrae: There are moderate features of spondylosis. The vertebra
are normal in heights. No fracture or focal lesion is seen.

Paraspinal and other soft tissues: Negative apart from mild aortic
atherosclerosis.

Disc levels: There is mild disc space loss at L4-S1. Above L4 the
discs are normal in heights.

Above L3 no significant disc bulge is seen or herniations. There is
mild facet spurring at L1-2, moderate asymmetric right facet
hypertrophy L2-3, and bilateral moderate facet hypertrophy at L3-4
and L4-S1.

There is slight nonstenosing posterior disc bulge at L3-4. At L4-S1
there is left paracentral shallow disc protrusion with overlying
annular calcification and slight compression of the left S1 nerve
root.

Due to facet hypertrophy, at L3-4 there is moderate left foraminal
stenosis and at L4-S1 there is moderate right and mild-to-moderate
left foraminal stenosis.
IMPRESSION: 1. Degenerative changes and mild levoscoliosis without evidence of
fractures or focal bone lesion.
2. Shallow chronic left paracentral herniated disc L4-S1 with mild
compression of the left S1 nerve root.
3. Hypoplastic T12 ribs with only 4 formed lumbar segments and no
transitional segment.
4. CTA abdomen and pelvis today demonstrated peridiscal endplate
erosions and fragmentation anteriorly at T9-10, above the level of
the current study. Findings consistent with spondylodiscitis.

## 2022-02-26 IMAGING — CT CT CTA ABD/PEL W/CM AND/OR W/O CM
3 of 12 series · 8 of 46 positions shown, 13 images · IV contrast (agent unspecified)
Comparison: CTA abdomen and pelvis and reconstructions of
[DATE], CT abdomen and pelvis no contrast [DATE].
COMPARISON: CTA abdomen and pelvis and reconstructions of
[DATE], CT abdomen and pelvis no contrast [DATE].

Addendum:
CLINICAL DATA: Renal artery stenosis. Recent right renal artery
thrombus. Presents with right abdominal pain.

EXAM:
CTA ABDOMEN AND PELVIS WITHOUT AND WITH CONTRAST
TECHNIQUE: Multidetector CT imaging of the abdomen and pelvis was performed
using the standard protocol during bolus administration of
intravenous contrast. Multiplanar reconstructed images and MIPs were
obtained and reviewed to evaluate the vascular anatomy.

[Series 5: venous · axial · portal-venous · 0.79mm/px · z∈[-735,-251]mm · 3 of 243 slices shown, 7 images]
[im 1/243  soft-tissue]
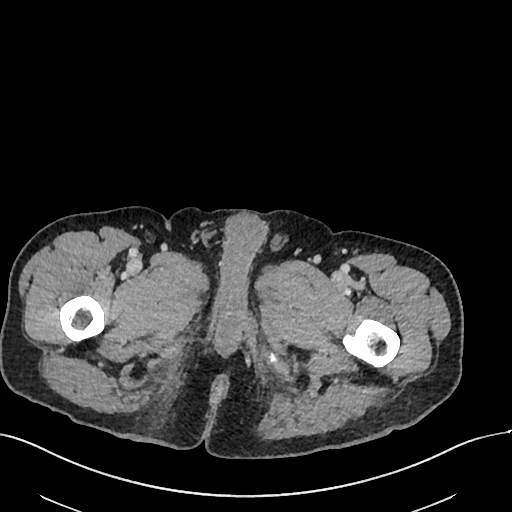
[im 1/243  lung]
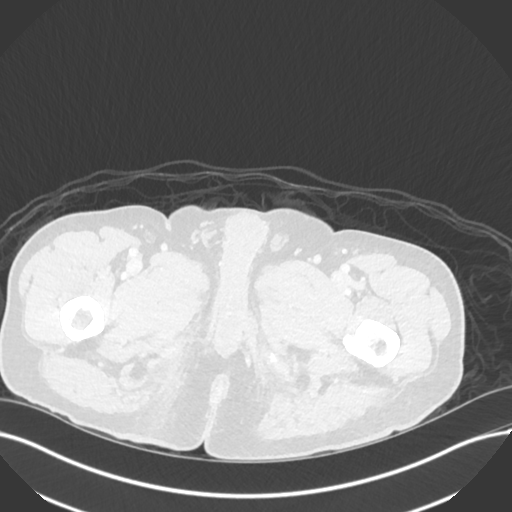
[im 1/243  bone]
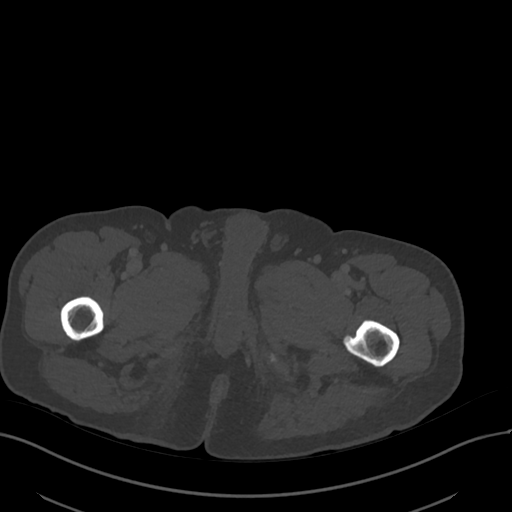
[im 122/243  soft-tissue]
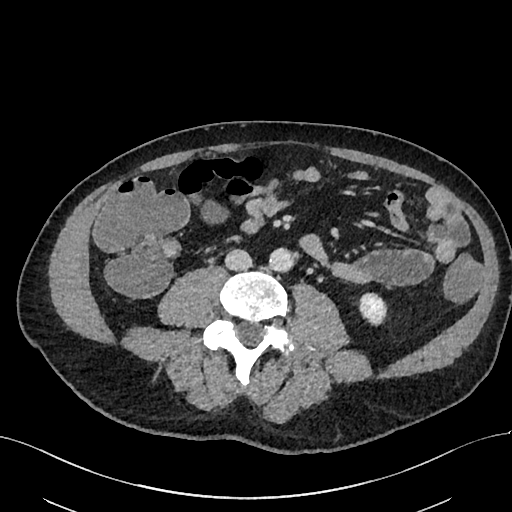
[im 122/243  lung]
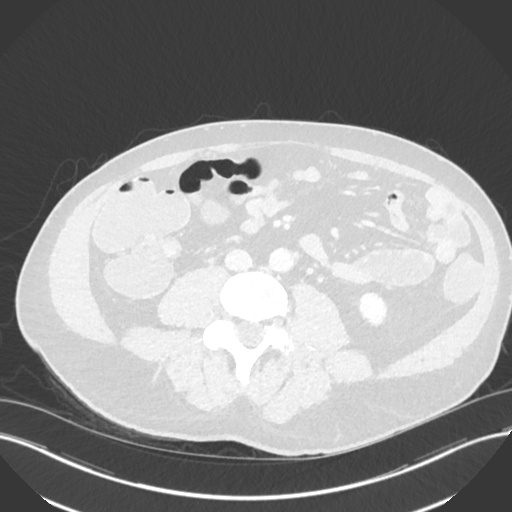
[im 243/243  soft-tissue]
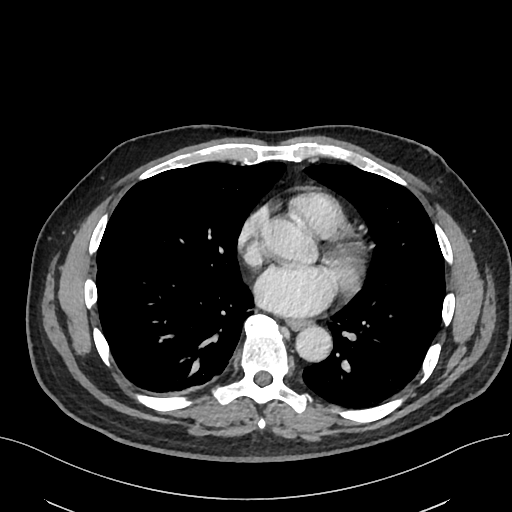
[im 243/243  lung]
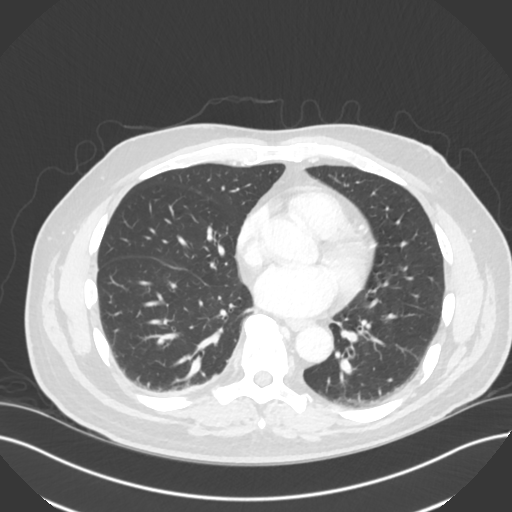

[Series 6: venous thins · axial · portal-venous · 0.79mm/px · z∈[-695,-574]mm · 3 of 1211 slices shown]
[im 101/1211  soft-tissue]
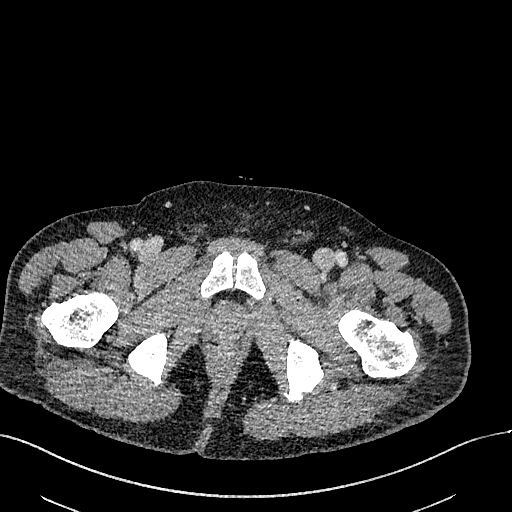
[im 303/1211  soft-tissue]
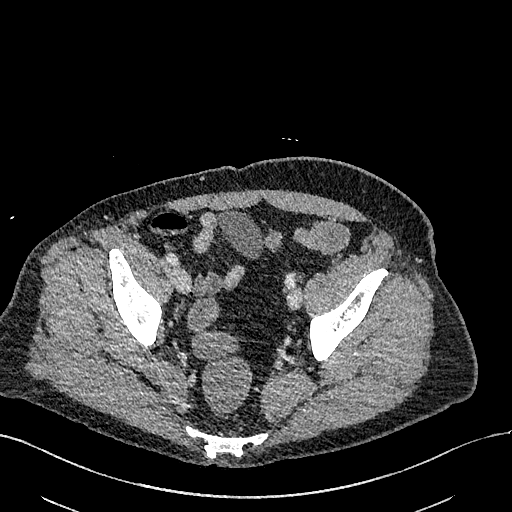
[im 404/1211  soft-tissue]
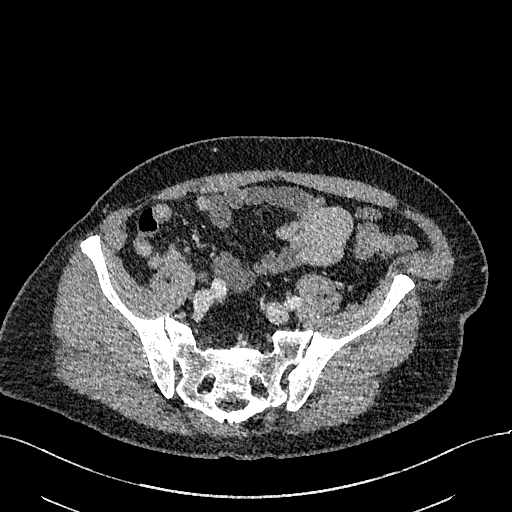

[Series 9: cor · coronal · 0.87mm/px · 2 of 143 slices shown, 3 images]
[im 48/143  soft-tissue]
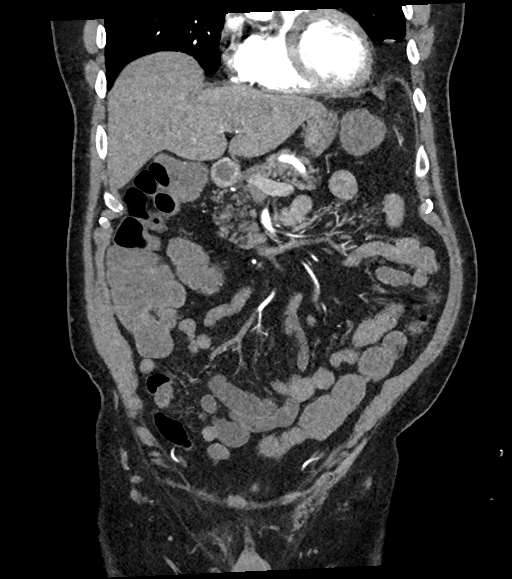
[im 48/143  bone]
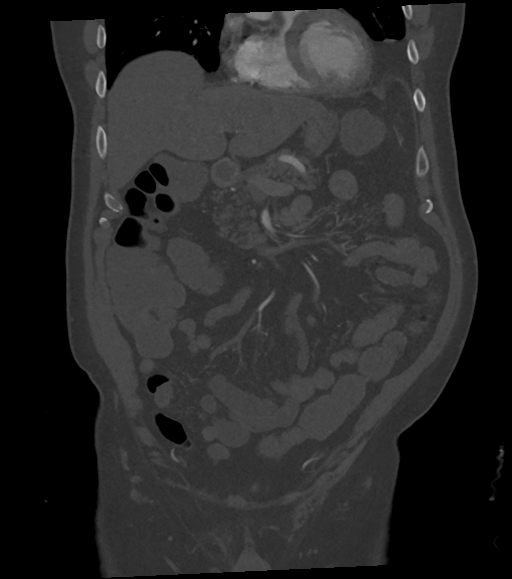
[im 95/143  soft-tissue]
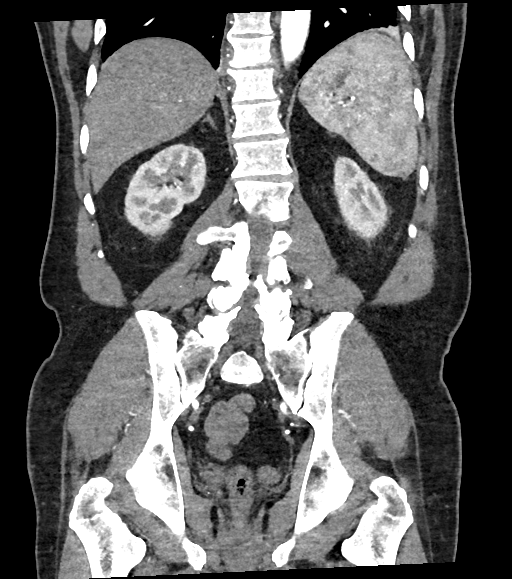

[8 of 46 positions shown; findings below may reference images not displayed]

RADIATION DOSE REDUCTION: This exam was performed according to the
departmental dose-optimization program which includes automated
exposure control, adjustment of the mA and/or kV according to
patient size and/or use of iterative reconstruction technique.

CONTRAST:  100mL OMNIPAQUE IOHEXOL 350 MG/ML SOLN
FINDINGS: VASCULAR

Aorta: Small amount of scattered mixed plaque. No aneurysm,
dissection or stenosis.

Celiac: Patent without evidence of aneurysm, dissection, vasculitis
or significant stenosis. There are Trace ostial calcifications which
are nonstenosing.

SMA: Patent without evidence of aneurysm, dissection, vasculitis or
significant stenosis.

Renals: Both renal arteries are patent without evidence of aneurysm,
dissection, vasculitis, fibromuscular dysplasia or significant
stenosis. Left renal artery is single. On the right there is a
hypoplastic artery to the lower pole arising just above the main
artery. Right renal artery thrombus is no longer seen.

IMA: Patent without evidence of aneurysm, dissection, vasculitis or
significant stenosis.

Inflow: Patent without evidence of aneurysm, dissection, vasculitis
or significant stenosis. There is minimal calcification in the
distal left common iliac artery. No other iliac arterial calcific
plaques are seen.

Proximal Outflow: Bilateral common femoral and visualized portions
of the superficial and profunda femoral arteries are patent without
evidence of aneurysm, dissection, vasculitis or significant
stenosis.

Veins: Clear.

Review of the MIP images confirms the above findings.

NON-VASCULAR

Lower chest: No acute abnormality. There is a trace right pleural
effusion. Subsegmental atelectasis both bases. The cardiac size is
normal.

Hepatobiliary: There is a mildly steatotic and otherwise
unremarkable liver. The gallbladder is absent without biliary
dilatation.

Pancreas: Partial fatty replacement in the head and proximal neck.
No mass enhancement or ductal dilatation.

Spleen: No mass enhancement. Mild splenomegaly with splenic AP
diameter 14.6 cm

Adrenals/Urinary Tract: Chronic perinephric stranding. No adrenal
mass or renal mass enhancement. No calculus or hydronephrosis.
Unremarkable bladder.

Stomach/Bowel: No dilatation or wall thickening. An appendix is not
seen. There is fluid throughout the colon but no inflammatory
changes. Scattered uncomplicated colonic diverticula.

Lymphatic: No adenopathy is seen.

Reproductive: There is no prostatomegaly.

Other: Small umbilical and inguinal fat hernias. There is no
incarcerated hernia. There is no free air, hemorrhage or fluid.

Musculoskeletal: There is new demonstration of peridiscal endplate
erosions and bony fragmentation anteriorly at T10-11 and adjacent
paraspinal soft tissue stranding most likely due to phlegmon. No
paraspinal abscess is seen. Findings strongly suspect
spondylodiscitis.

There are degenerative changes in the thoracic and lumbar spine
without further new abnormality. There is mild lumbar levoscoliosis
apex at L2. There is mild hip DJD.
IMPRESSION: VASCULAR

1. Interval resolution of right renal artery thrombosis.
2. Scattered aortic atherosclerosis but no stenosis, aneurysm or
dissection in the aorta and branch arteries.

NON-VASCULAR

1. Fluid in the colon but no wall thickening or inflammatory
changes. Findings may suggest a nonspecific diarrheal illness.
2. Interval new finding of peridiscal endplate erosions anteriorly
with bony fragmentation at T10-11 with adjacent soft tissue reaction
most likely due to phlegmon. Findings are of strong concern for
spondylodiscitis. No epidural extension of the process is suspected
at this time.
3. Trace right pleural effusion.
4. Mildly prominent spleen.
5. Small umbilical and inguinal fat hernias.
6. Additional findings described above.

ADDENDUM:
The patient only has 4 lumbar segments with hypoplastic ribs at T12.
This is confirmed on prior chest CT from [[facility]]. The anterior endplate
erosions described in this report are therefore actually at T9-10,
not at T10-11.

*** End of Addendum ***
RADIATION DOSE REDUCTION: This exam was performed according to the
departmental dose-optimization program which includes automated
exposure control, adjustment of the mA and/or kV according to
patient size and/or use of iterative reconstruction technique.

CONTRAST:  100mL OMNIPAQUE IOHEXOL 350 MG/ML SOLN
FINDINGS: VASCULAR

Aorta: Small amount of scattered mixed plaque. No aneurysm,
dissection or stenosis.

Celiac: Patent without evidence of aneurysm, dissection, vasculitis
or significant stenosis. There are Trace ostial calcifications which
are nonstenosing.

SMA: Patent without evidence of aneurysm, dissection, vasculitis or
significant stenosis.

Renals: Both renal arteries are patent without evidence of aneurysm,
dissection, vasculitis, fibromuscular dysplasia or significant
stenosis. Left renal artery is single. On the right there is a
hypoplastic artery to the lower pole arising just above the main
artery. Right renal artery thrombus is no longer seen.

IMA: Patent without evidence of aneurysm, dissection, vasculitis or
significant stenosis.

Inflow: Patent without evidence of aneurysm, dissection, vasculitis
or significant stenosis. There is minimal calcification in the
distal left common iliac artery. No other iliac arterial calcific
plaques are seen.

Proximal Outflow: Bilateral common femoral and visualized portions
of the superficial and profunda femoral arteries are patent without
evidence of aneurysm, dissection, vasculitis or significant
stenosis.

Veins: Clear.

Review of the MIP images confirms the above findings.

NON-VASCULAR

Lower chest: No acute abnormality. There is a trace right pleural
effusion. Subsegmental atelectasis both bases. The cardiac size is
normal.

Hepatobiliary: There is a mildly steatotic and otherwise
unremarkable liver. The gallbladder is absent without biliary
dilatation.

Pancreas: Partial fatty replacement in the head and proximal neck.
No mass enhancement or ductal dilatation.

Spleen: No mass enhancement. Mild splenomegaly with splenic AP
diameter 14.6 cm

Adrenals/Urinary Tract: Chronic perinephric stranding. No adrenal
mass or renal mass enhancement. No calculus or hydronephrosis.
Unremarkable bladder.

Stomach/Bowel: No dilatation or wall thickening. An appendix is not
seen. There is fluid throughout the colon but no inflammatory
changes. Scattered uncomplicated colonic diverticula.

Lymphatic: No adenopathy is seen.

Reproductive: There is no prostatomegaly.

Other: Small umbilical and inguinal fat hernias. There is no
incarcerated hernia. There is no free air, hemorrhage or fluid.

Musculoskeletal: There is new demonstration of peridiscal endplate
erosions and bony fragmentation anteriorly at T10-11 and adjacent
paraspinal soft tissue stranding most likely due to phlegmon. No
paraspinal abscess is seen. Findings strongly suspect
spondylodiscitis.

There are degenerative changes in the thoracic and lumbar spine
without further new abnormality. There is mild lumbar levoscoliosis
apex at L2. There is mild hip DJD.
IMPRESSION: VASCULAR

1. Interval resolution of right renal artery thrombosis.
2. Scattered aortic atherosclerosis but no stenosis, aneurysm or
dissection in the aorta and branch arteries.

NON-VASCULAR

1. Fluid in the colon but no wall thickening or inflammatory
changes. Findings may suggest a nonspecific diarrheal illness.
2. Interval new finding of peridiscal endplate erosions anteriorly
with bony fragmentation at T10-11 with adjacent soft tissue reaction
most likely due to phlegmon. Findings are of strong concern for
spondylodiscitis. No epidural extension of the process is suspected
at this time.
3. Trace right pleural effusion.
4. Mildly prominent spleen.
5. Small umbilical and inguinal fat hernias.
6. Additional findings described above.

## 2022-02-26 IMAGING — MR MR LUMBAR SPINE WO/W CM
5 of 8 series · 29 of 48 positions shown · IV contrast (Gadavist)
Comparison: Thoracic spine MRI today reported separately. CTA
abdomen and pelvis [F9] hours today.

CLINICAL DATA: 60-year-old male with mid back pain since last
month. Anterior endplate erosions at T10-T11 on CTA suspicious for
[F9].

EXAM:
MRI LUMBAR SPINE WITHOUT AND WITH CONTRAST
TECHNIQUE: Multiplanar and multiecho pulse sequences of the lumbar spine were
obtained without and with intravenous contrast.
CONTRAST:  10mL GADAVIST GADOBUTROL 1 MMOL/ML IV SOLN

[Series 1: T2 · sagittal · 4.0mm · 0.73mm/px · 4 of 19 slices shown (1 of 3)]
[im 1/19]
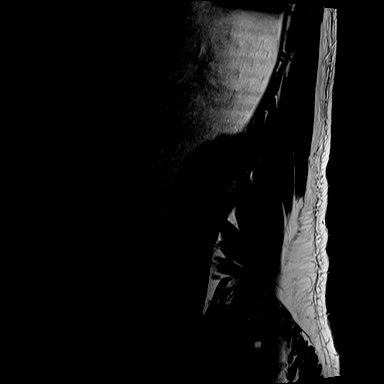
[im 7/19]
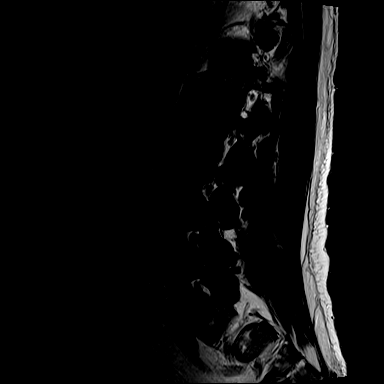
[im 13/19]
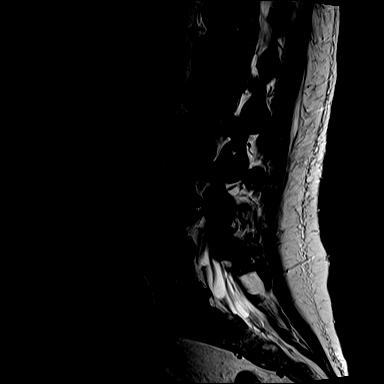
[im 19/19]
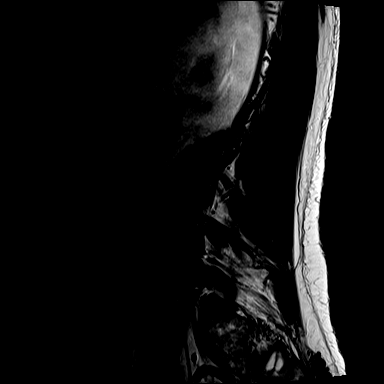

[Series 3: T1 · sagittal · 4.0mm · 0.88mm/px · 4 of 19 slices shown (1 of 2)]
[im 1/19]
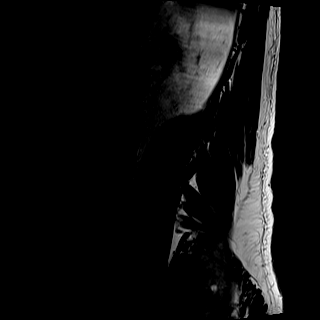
[im 7/19]
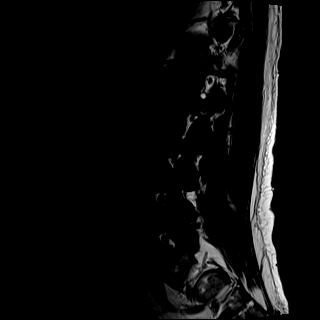
[im 13/19]
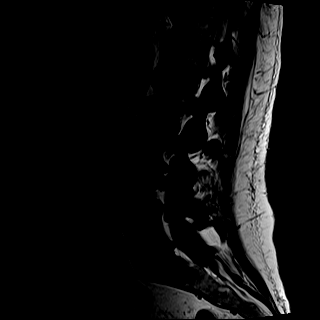
[im 19/19]
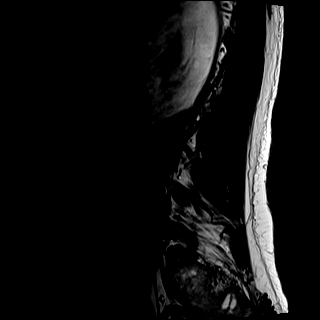

[Series 4: T2 · axial · 5.0mm · 0.65mm/px · z∈[-394,-116]mm · 8 of 40 slices shown (2 of 3)]
[im 1/40]
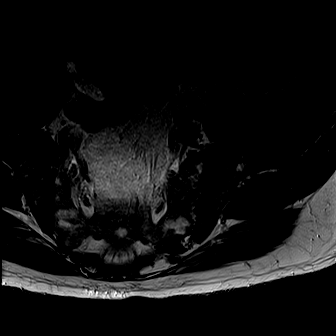
[im 6/40]
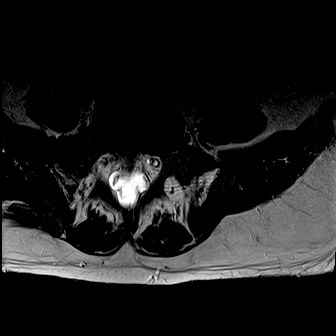
[im 12/40]
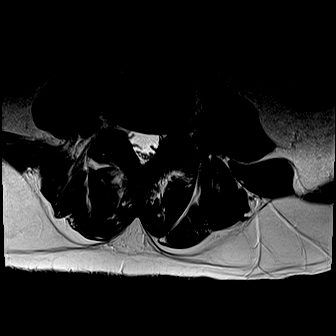
[im 17/40]
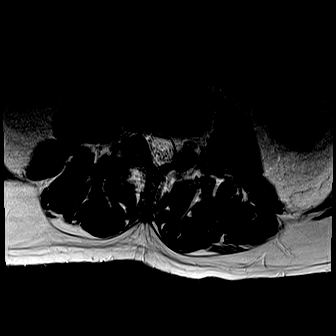
[im 23/40]
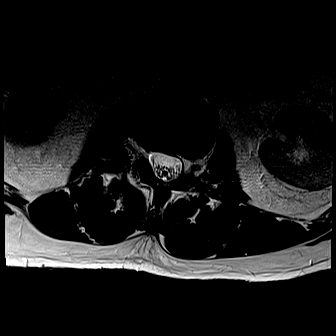
[im 28/40]
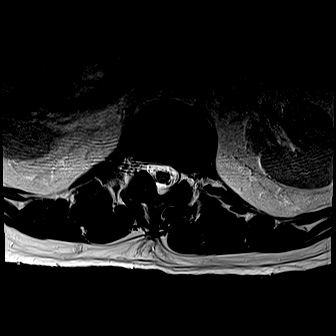
[im 34/40]
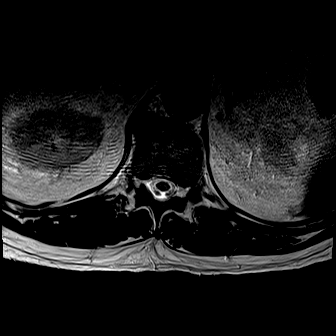
[im 40/40]
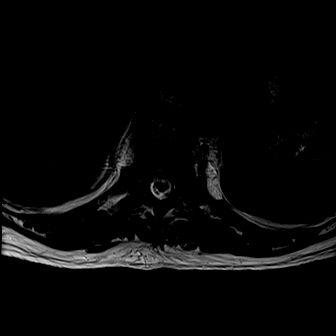

[Series 5: T1 · axial · 5.0mm · 0.34mm/px · z∈[-394,-236]mm · 5 of 40 slices shown (2 of 2)]
[im 1/40]
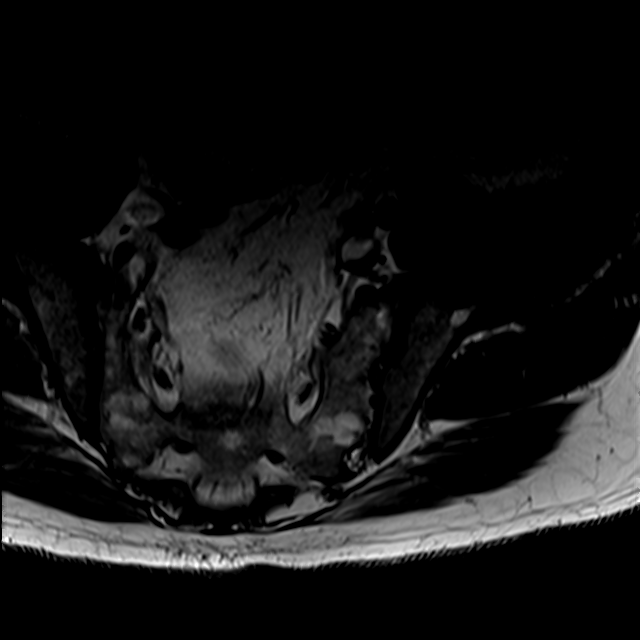
[im 6/40]
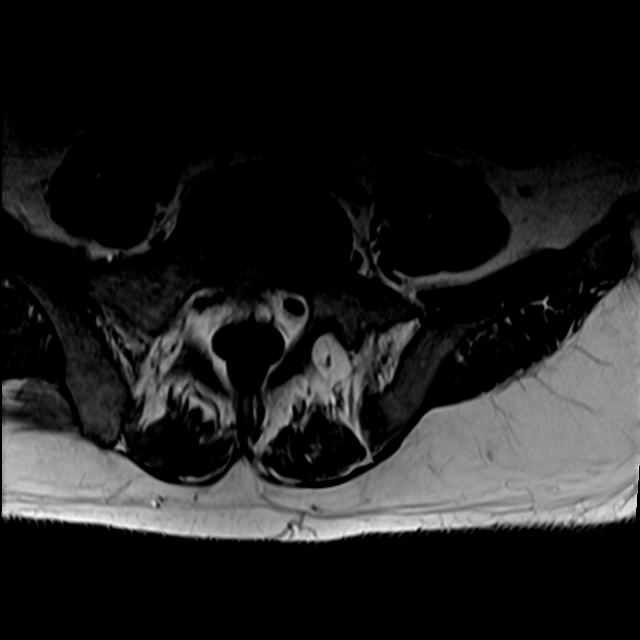
[im 12/40]
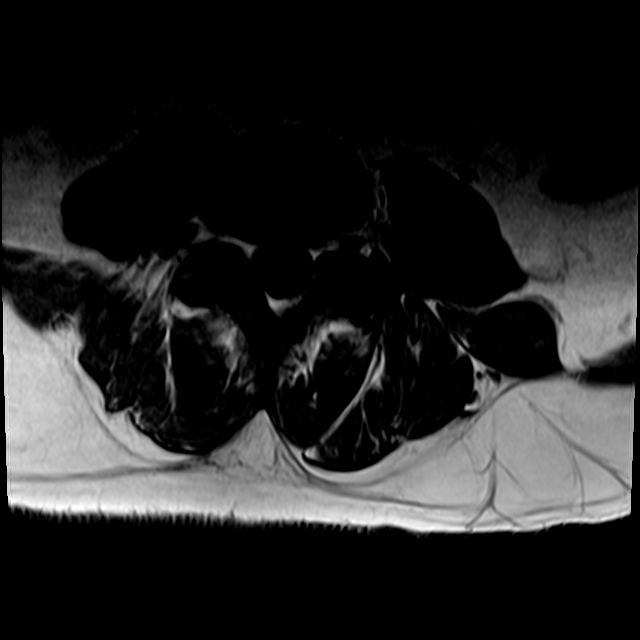
[im 17/40]
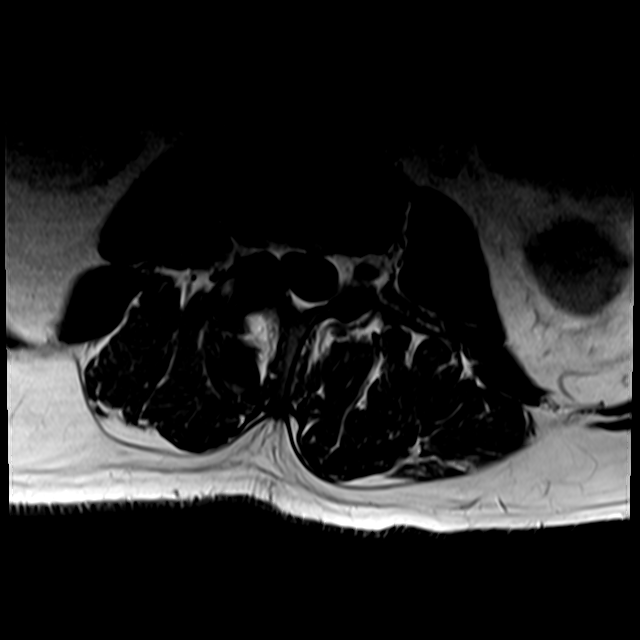
[im 23/40]
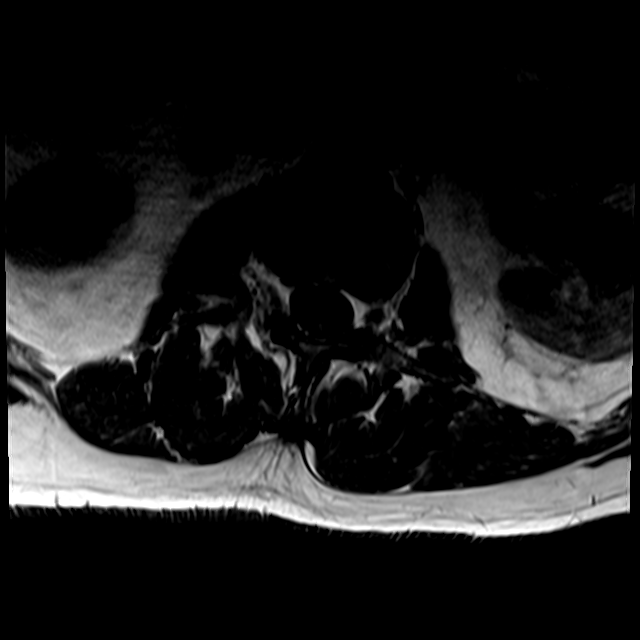

[Series 6: T2 · axial · 5.0mm · 0.65mm/px · z∈[-394,-117]mm · 8 of 40 slices shown (3 of 3)]
[im 1/40]
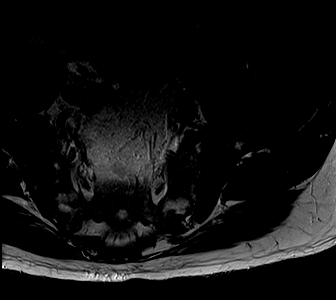
[im 6/40]
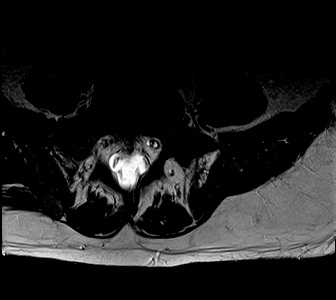
[im 12/40]
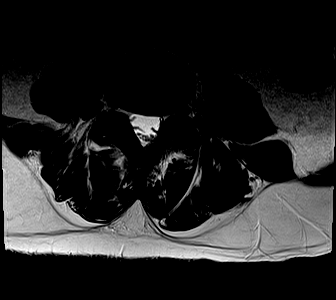
[im 17/40]
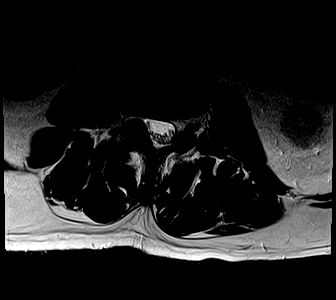
[im 23/40]
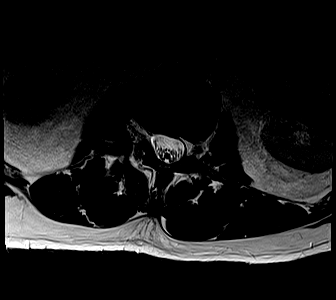
[im 28/40]
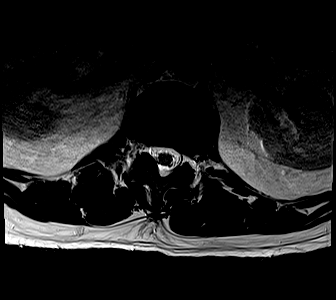
[im 34/40]
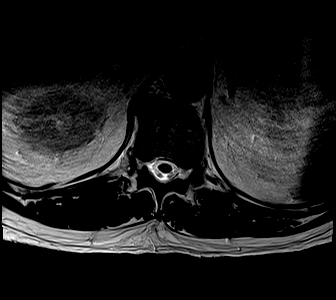
[im 40/40]
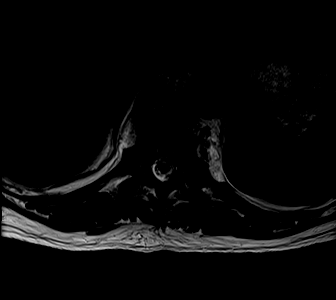

[29 of 48 positions shown; findings below may reference images not displayed]

FINDINGS: Segmentation: Transitional anatomy, as demonstrated on the thoracic
MRI today. Fully sacralized L5 level. Correlation with radiographs
is recommended prior to any operative intervention.

Alignment: Mild levoconvex lumbar scoliosis. Relatively preserved
lumbar lordosis. No significant spondylolisthesis.

Vertebrae: Redemonstrated faint abnormal marrow edema and
enhancement in the right superior T12 vertebral body as questioned
on the thoracic MRI today (series 7, image 7 and series 2, image 7).

But no other marrow edema or enhancement. Background bone marrow
signal is within normal limits. Intact visible sacrum and SI joints.

Conus medullaris and cauda equina: Conus extends to the T12-L1
level. No lower spinal cord or conus signal abnormality. No abnormal
intradural enhancement. No dural thickening. Capacious lumbar spinal
canal. Cauda equina nerve roots appear normal.

Paraspinal and other soft tissues: Negative.

Disc levels:

Similar to the thoracic MRI today, lumbar disc degeneration is
generally mild for age and there is intermittent lumbar posterior
element hypertrophy.

No lumbar spinal stenosis. Small central disc protrusion at L4-L5
does not result in lateral recess stenosis. There is mild
degenerative neural foraminal stenosis at the left L2, left L3,
bilateral L4 nerve levels.

L5-S1 is completely sacralized.
IMPRESSION: 1. Transitional anatomy with fully sacralized L5 level when
numbering from the skull base. Correlation with radiographs is
recommended prior to any operative intervention.

2. Confirmed faint abnormal marrow edema and enhancement in the
superior T12 vertebral body on the right. See associated
abnormalities detailed on Thoracic MRI today.

3. But negative for age MRI appearance of the Lumbar spine. Mild
degenerative lumbar neural foraminal stenosis.

## 2022-02-26 IMAGING — DX DG ORTHOPANTOGRAM /PANORAMIC
1 series · 1 of 1 positions shown · non-contrast
Comparison: None Available.

CLINICAL DATA: Known spinal infection, evaluate for possible source

EXAM:
ORTHOPANTOGRAM/PANORAMIC

[view not recorded]
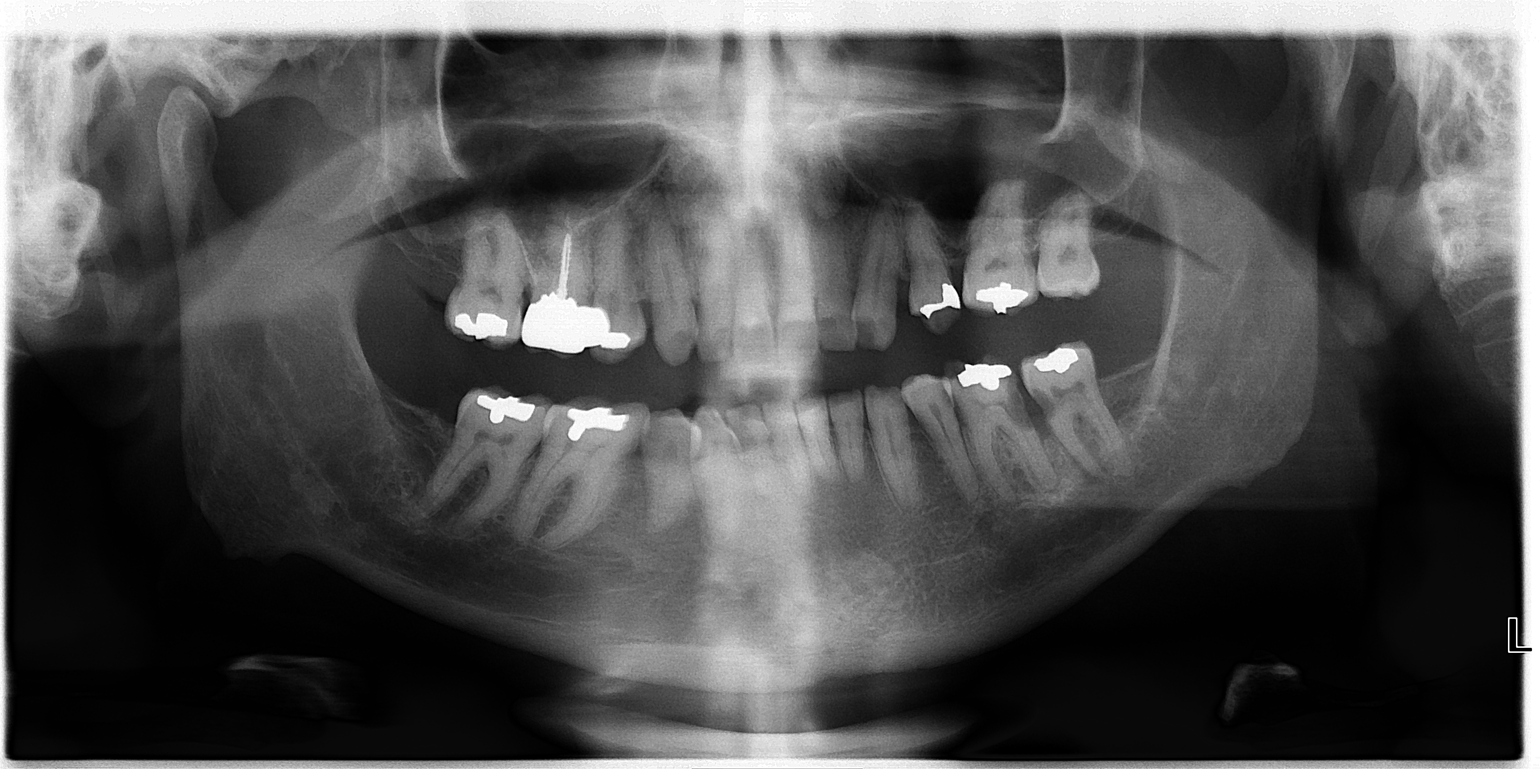

[1 of 1 positions shown; findings below may reference images not displayed]

FINDINGS: Panoramic view of the mandible was obtained and reveals significant
dental hardware. Dental caries are noted at the base of the first
and second molars on the left in the maxilla. No periapical lucency
to suggest abscess is noted. No other focal abnormality is seen.
IMPRESSION: Dental caries and prior dental hardware.

No evidence of periapical abscess is seen.

## 2022-02-26 MED ORDER — BISACODYL 5 MG PO TBEC: 5.0000 mg | DELAYED_RELEASE_TABLET | Freq: Every day | ORAL | Status: DC | PRN

## 2022-02-26 MED ORDER — MORPHINE SULFATE (PF) 2 MG/ML IV SOLN
2.0000 mg | INTRAVENOUS | Status: DC | PRN
  Administered 2022-02-26 (×2): 2 mg via INTRAVENOUS
  Filled 2022-02-26 (×2): qty 1

## 2022-02-26 MED ORDER — PIPERACILLIN-TAZOBACTAM 3.375 G IVPB
3.3750 g | Freq: Once | INTRAVENOUS | Status: AC
  Administered 2022-02-26: 3.375 g via INTRAVENOUS
  Filled 2022-02-26: qty 50

## 2022-02-26 MED ORDER — ONDANSETRON HCL 4 MG PO TABS: 4.0000 mg | ORAL_TABLET | Freq: Four times a day (QID) | ORAL | Status: DC | PRN

## 2022-02-26 MED ORDER — ACETAMINOPHEN 325 MG PO TABS: 650.0000 mg | ORAL_TABLET | Freq: Four times a day (QID) | ORAL | Status: DC | PRN

## 2022-02-26 MED ORDER — OXYCODONE HCL 5 MG PO TABS
10.0000 mg | ORAL_TABLET | ORAL | Status: DC | PRN
  Administered 2022-02-26 – 2022-03-02 (×12): 10 mg via ORAL
  Filled 2022-02-26 (×14): qty 2

## 2022-02-26 MED ORDER — VANCOMYCIN HCL 2000 MG/400ML IV SOLN
2000.0000 mg | Freq: Once | INTRAVENOUS | Status: AC
Start: 2022-02-26 — End: 2022-02-26
  Administered 2022-02-26: 2000 mg via INTRAVENOUS
  Filled 2022-02-26: qty 400

## 2022-02-26 MED ORDER — HYDROMORPHONE HCL 1 MG/ML IJ SOLN
0.5000 mg | INTRAMUSCULAR | Status: DC | PRN
Start: 2022-02-26 — End: 2022-02-26
  Administered 2022-02-26 (×2): 0.5 mg via INTRAVENOUS
  Filled 2022-02-26 (×2): qty 1

## 2022-02-26 MED ORDER — ONDANSETRON HCL 4 MG/2ML IJ SOLN
4.0000 mg | Freq: Four times a day (QID) | INTRAMUSCULAR | Status: DC | PRN
Start: 2022-02-26 — End: 2022-02-26

## 2022-02-26 MED ORDER — GADOBUTROL 1 MMOL/ML IV SOLN
10.0000 mL | Freq: Once | INTRAVENOUS | Status: AC | PRN
  Administered 2022-02-26: 10 mL via INTRAVENOUS

## 2022-02-26 MED ORDER — ONDANSETRON HCL 4 MG/2ML IJ SOLN
4.0000 mg | Freq: Four times a day (QID) | INTRAMUSCULAR | Status: DC | PRN
Start: 2022-02-26 — End: 2022-03-02

## 2022-02-26 MED ORDER — SODIUM CHLORIDE 0.9 % IV BOLUS
1000.0000 mL | Freq: Once | INTRAVENOUS | Status: AC
Start: 2022-02-26 — End: 2022-02-26
  Administered 2022-02-26: 1000 mL via INTRAVENOUS

## 2022-02-26 MED ORDER — OXYCODONE HCL 5 MG PO TABS: 5.0000 mg | ORAL_TABLET | ORAL | Status: DC | PRN

## 2022-02-26 MED ORDER — HYDROMORPHONE HCL 1 MG/ML IJ SOLN
1.0000 mg | Freq: Once | INTRAMUSCULAR | Status: AC
Start: 2022-02-26 — End: 2022-02-26
  Administered 2022-02-26: 1 mg via INTRAVENOUS
  Filled 2022-02-26: qty 1

## 2022-02-26 MED ORDER — SODIUM CHLORIDE 0.9 % IV SOLN: 2.0000 g | INTRAVENOUS | Status: DC

## 2022-02-26 MED ORDER — TAMSULOSIN HCL 0.4 MG PO CAPS
0.4000 mg | ORAL_CAPSULE | Freq: Every day | ORAL | Status: DC
  Administered 2022-02-26 – 2022-03-02 (×5): 0.4 mg via ORAL
  Filled 2022-02-26 (×5): qty 1

## 2022-02-26 MED ORDER — POLYETHYLENE GLYCOL 3350 17 G PO PACK
17.0000 g | PACK | Freq: Every day | ORAL | Status: DC | PRN
  Administered 2022-02-27 – 2022-02-28 (×2): 17 g via ORAL
  Filled 2022-02-26 (×2): qty 1

## 2022-02-26 MED ORDER — MUPIROCIN 2 % EX OINT
1.0000 | TOPICAL_OINTMENT | Freq: Two times a day (BID) | CUTANEOUS | Status: DC
Start: 2022-02-26 — End: 2022-03-02
  Administered 2022-02-26 – 2022-03-01 (×6): 1 via NASAL
  Filled 2022-02-26 (×2): qty 22

## 2022-02-26 MED ORDER — LACTATED RINGERS IV SOLN: INTRAVENOUS | Status: DC

## 2022-02-26 MED ORDER — VANCOMYCIN HCL 1500 MG/300ML IV SOLN
1500.0000 mg | Freq: Two times a day (BID) | INTRAVENOUS | Status: DC
  Filled 2022-02-26: qty 300

## 2022-02-26 MED ORDER — HYDROMORPHONE HCL 1 MG/ML IJ SOLN
1.0000 mg | INTRAMUSCULAR | Status: DC | PRN
  Administered 2022-02-26 – 2022-02-28 (×7): 1 mg via INTRAVENOUS
  Filled 2022-02-26 (×7): qty 1

## 2022-02-26 MED ORDER — ACETAMINOPHEN 650 MG RE SUPP: 650.0000 mg | Freq: Four times a day (QID) | RECTAL | Status: DC | PRN

## 2022-02-26 MED ORDER — GABAPENTIN 300 MG PO CAPS
600.0000 mg | ORAL_CAPSULE | Freq: Every day | ORAL | Status: DC
  Administered 2022-02-26 – 2022-03-01 (×4): 600 mg via ORAL
  Filled 2022-02-26 (×4): qty 2

## 2022-02-26 MED ORDER — HYDRALAZINE HCL 20 MG/ML IJ SOLN: 5.0000 mg | INTRAMUSCULAR | Status: DC | PRN

## 2022-02-26 MED ORDER — IOHEXOL 350 MG/ML SOLN
100.0000 mL | Freq: Once | INTRAVENOUS | Status: AC | PRN
  Administered 2022-02-26: 100 mL via INTRAVENOUS

## 2022-02-26 MED ORDER — SODIUM CHLORIDE 0.9% FLUSH
3.0000 mL | Freq: Two times a day (BID) | INTRAVENOUS | Status: DC
  Administered 2022-02-26 – 2022-03-02 (×7): 3 mL via INTRAVENOUS

## 2022-02-26 MED ORDER — NICOTINE 14 MG/24HR TD PT24
14.0000 mg | MEDICATED_PATCH | Freq: Every day | TRANSDERMAL | Status: DC
  Filled 2022-02-26 (×4): qty 1

## 2022-02-26 MED ORDER — ENSURE ENLIVE PO LIQD
237.0000 mL | Freq: Two times a day (BID) | ORAL | Status: DC
  Administered 2022-02-27 – 2022-03-02 (×6): 237 mL via ORAL

## 2022-02-26 MED ORDER — NALOXONE HCL 0.4 MG/ML IJ SOLN: 0.4000 mg | INTRAMUSCULAR | Status: DC | PRN

## 2022-02-26 NOTE — ED Provider Notes (Signed)
[facility] Emergency Department Provider Note MRN:  [id number]  Arrival date & time: 02/26/22     Chief Complaint   Abdominal Pain   History of Present Illness   Christopher [name]. is a 61 y.o. year-old male with no prior past medical history presenting to the ED with chief complaint of abdominal pain.  Right-sided abdominal pain for the past month, worse over the past day or so.  Was in the hospital for several days at [facility], no clear cause of pain.  Lifted something heavy at work prior to onset.  No nausea, no vomiting, no fever, normal bowel movements.  Review of Systems  A thorough review of systems was obtained and all systems are negative except as noted in the HPI and PMH.   Patient's Health History    Past Medical History:  Diagnosis Date   Prostatitis    Restless leg     Past Surgical History:  Procedure Laterality Date   CHOLECYSTECTOMY      History reviewed. No pertinent family history.  Social History   Socioeconomic History   Marital status: Married    Spouse name: Not on file   Number of children: Not on file   Years of education: Not on file   Highest education level: Not on file  Occupational History   Not on file  Tobacco Use   Smoking status: Never   Smokeless tobacco: Current    Types: Snuff  Substance and Sexual Activity   Alcohol use: Yes    Alcohol/week: 2.0 standard drinks    Types: 2 Cans of beer per week   Drug use: Not Currently   Sexual activity: Not on file  Other Topics Concern   Not on file  Social History Narrative   Not on file   Social Determinants of Health   Financial Resource Strain: Not on file  Food Insecurity: Not on file  Transportation Needs: Not on file  Physical Activity: Not on file  Stress: Not on file  Social Connections: Not on file  Intimate Partner Violence: Not on file     Physical Exam   Vitals:   02/26/22 0132 02/26/22 0400  BP: 128/82 121/85   Pulse: 69 63  Resp: 18   Temp:    SpO2: 96% 99%    CONSTITUTIONAL: Well-appearing, NAD NEURO/PSYCH:  Alert and oriented x 3, no focal deficits EYES:  eyes equal and reactive ENT/NECK:  no LAD, no JVD CARDIO: Regular rate, well-perfused, normal S1 and S2 PULM:  CTAB no wheezing or rhonchi GI/GU:  non-distended, mild right-sided abdominal tenderness MSK/SPINE:  No gross deformities, no edema SKIN:  no rash, atraumatic   *Additional and/or pertinent findings included in MDM below  Diagnostic and Interventional Summary    EKG Interpretation  Date/Time:    Ventricular Rate:    PR Interval:    QRS Duration:   QT Interval:    QTC Calculation:   R Axis:     Text Interpretation:         Labs Reviewed  COMPREHENSIVE METABOLIC PANEL - Abnormal; Notable for the following components:      Result Value   Glucose, Bld 117 (*)    Total Bilirubin 1.4 (*)    All other components within normal limits  CBC - Abnormal; Notable for the following components:   HCT 38.8 (*)    All other components within normal limits  URINALYSIS, ROUTINE W REFLEX MICROSCOPIC - Abnormal; Notable for the  following components:   Hgb urine dipstick SMALL (*)    Ketones, ur 20 (*)    All other components within normal limits  SEDIMENTATION RATE - Abnormal; Notable for the following components:   Sed Rate 77 (*)    All other components within normal limits  C-REACTIVE PROTEIN - Abnormal; Notable for the following components:   CRP 5.4 (*)    All other components within normal limits  CULTURE, BLOOD (ROUTINE X 2)  CULTURE, BLOOD (ROUTINE X 2)  LIPASE, BLOOD    CT Angio Abd/Pel w/ and/or w/o  Final Result  Addendum (preliminary) 1 of 1  ADDENDUM REPORT: 02/26/2022 03:34    ADDENDUM:  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  This is confirmed on prior chest CT from 2019. The anterior endplate  erosions described in this report are therefore actually at T9-10,  not at  T10-11.      Electronically Signed    By: Telford Nab M.D.    On: 02/26/2022 03:34      Final    CT L-SPINE NO CHARGE  Final Result    MR THORACIC SPINE W WO CONTRAST    (Results Pending)  MR Lumbar Spine W Wo Contrast    (Results Pending)    Medications  piperacillin-tazobactam (ZOSYN) IVPB 3.375 g (3.375 g Intravenous New Bag/Given 02/26/22 0348)  vancomycin (VANCOREADY) IVPB 2000 mg/400 mL (has no administration in time range)  acetaminophen (TYLENOL) tablet 650 mg (has no administration in time range)    Or  acetaminophen (TYLENOL) suppository 650 mg (has no administration in time range)  naloxone (NARCAN) injection 0.4 mg (has no administration in time range)  HYDROmorphone (DILAUDID) injection 0.5 mg (has no administration in time range)  ondansetron (ZOFRAN) injection 4 mg (has no administration in time range)  vancomycin (VANCOREADY) IVPB 1500 mg/300 mL (has no administration in time range)  sodium chloride 0.9 % bolus 1,000 mL (1,000 mLs Intravenous New Bag/Given 02/26/22 0137)  HYDROmorphone (DILAUDID) injection 1 mg (1 mg Intravenous Given 02/26/22 0138)  iohexol (OMNIPAQUE) 350 MG/ML injection 100 mL (100 mLs Intravenous Contrast Given 02/26/22 0236)     Procedures  /  Critical Care .Critical Care Performed by: Maudie Flakes, MD Authorized by: Maudie Flakes, MD   Critical care provider statement:    Critical care time (minutes):  35   Critical care was necessary to treat or prevent imminent or life-threatening deterioration of the following conditions: Spondylodiscitis.   Critical care was time spent personally by me on the following activities:  Development of treatment plan with patient or surrogate, discussions with consultants, evaluation of patient's response to treatment, examination of patient, ordering and review of laboratory studies, ordering and review of radiographic studies, ordering and performing treatments and interventions, pulse oximetry,  re-evaluation of patient's condition and review of old charts  ED Course and Medical Decision Making  Initial Impression and Ddx DDx includes kidney stone, appendicitis, diverticulitis, SBO, intra-abdominal mass, per chart review he had a renal thrombus causing stenosis during his admission and so this is considered as well.  Awaiting labs, CTA abdomen.  Past medical/surgical history that increases complexity of ED encounter: None  Interpretation of Diagnostics I personally reviewed the laboratory assessment and my interpretation is as follows: No significant blood count or electrolyte disturbance    CT imaging revealing likely spondylodiscitis with phlegmon.  Patient Reassessment and Ultimate Disposition/Management Case discussed with neurosurgery, plan is for hospitalist admission, antibiotics, ID  consultation in the morning.  Patient management required discussion with the following services or consulting groups:  Hospitalist Service and Neurosurgery  Complexity of Problems Addressed Acute illness or injury that poses threat of life of bodily function  Additional Data Reviewed and Analyzed Further history obtained from: Further history from spouse/family member  Additional Factors Impacting ED Encounter Risk Use of parenteral controlled substances and Consideration of hospitalization  Barth Kirks. 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

## 2022-02-26 NOTE — Consult Note (Addendum)
I have seen and examined the patient. I have personally reviewed the clinical findings, laboratory findings, microbiological data and imaging studies. The assessment and treatment plan was discussed with the Resident Gaylan Gerold. I agree with her/his recommendations except following additions/corrections.  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  Pain started approx a month ago when he lifted something heavy with popping noted  followed by headache, sore throat and fever/chills thereafter. Had intermittent fevers, night sweats, poor appetite and weight loss of approximately 25 pounds in the last 1 month.  He also takes Miralax due to difficulty with bowel movements.  Back pain got worse after he ate a big steak Wednesday evening.  He has significant decay of his left upper molars following his prior radiation treatment and he was referred to oral surgeon in [place] but he has never heard back from them.  Denies any oral or dental issues at this time or dysphagia or odynophagia.  Denies any respiratory/GU symptoms.  Denies nausea, vomiting. Denies joint pain or rashes.  Admitted 4/15-4/20 at [facility] for abdominal pain/right lower back pain (prior flulike symptoms and sore throat for which seen in the UC, given Tamiflu; later seen in the UC and was given ciprofloxacin for concern of UTI).  Work-up was remarkable for CT concerning for adynamic ileus, mild splenomeglay.  Status post EGD (several linear clean-based ulcers suggestive for NG trauma, antral mild erosions and colonoscopy.  Biopsy negative for dysplasia or malignancy/changes of H. pylori). 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  Smokes smokeless tobacco, 2 standard drinks per week, denies IVDU, prior back surgeries or history of steroid injections.  Denies history of malignancy in the family.  Lives with his wife.  Has 2 dogs at home.  He is a Engineer, civil (consulting).  Denies any recent sick contacts/TB contacts or travel history.  No known immunocompromising conditions/medications  Chart reviewed along with labs/imaging/microbiological data Afebrile, no leukcocytosis CRP 5.4, ESR 77 Per Neurosurgery, po plans for neurosurgical intervention, recommended IV abtx. On vancomycin currently  Will hold abtx for now as patient is not in sepsis with no neurological deficit/findings on exam. Start on broad spectrum abtx with Vancomycin and ceftriaxone in case of any signs of sepsis/new neurological findings/after biopsy is done.  IR evaluation for biopsy for pathology and cultures ( aerobic/anaerobic, fungal, AFB cx and smear). R/o malignancy given concern seen in MRI as well as prior h/o malignancy. Expect biopsy in next 24-48hrs Fb blood cultures ( seems to have drawn couple of hrs after zosyn) Panorex ordered  Fu HIV and quantiferon D/w primary team   Dr Gale Journey covering over the weekend and will follow  Rosiland Oz, MD Infectious Disease Physician [facility] [address] [place], [place] [address] Phone: [phone]  Fax: [facility]    Date of Admission:  02/26/2022     Total days of antibiotics 1         Vancomycin 5/19- Zosyn           5/19-    Reason for Consult: Possible discitis    Referring Provider: Dr. Lorin Mercy  Assessment: Patient with history of tonsillar SCC in remission, admitted to the hospital for right flank pain.  MRI thoracic showed bony erosions of anterior T9-10 with possible T12 body edema, suspicious for either osteomyelitis vs lytic lesions.  He has an atypical presentation for  osteomyelitis even though patient did have night sweats and subjective fever with chills for the past month.  He is afebrile here with no leukocytosis.  He has no risk factor for vertebral osteomyelitis.  No prior back surgery or steroid injection.  Patient is not immunocompromised except for prior chemo/radiation.  I am not sure if his right lower abdominal pain is related to this T9-10 vertebral body finding.  CT abdomen/pelvis did not reveal any obvious cause of his abdominal pain.  EGD/colonoscopy from last admission were unremarkable.  It could also be referred pain.  We will adjust his antibiotic to vancomycin and ceftriaxone.  If neurosurgery does not perform any intervention, recommend consult IR for tissue sampling to delineate infectious vs malignant causes.  Plan: Change vancomycin and ceftriaxone Pending neurosurgery recommendation.  If they do not recommend surgical intervention, please consult IR for tissue sampling.  Would obtain aerobic/anaerobic culture and pathology. Pending blood culture result  Principal Problem:   Back pain Active Problems:   Tonsillar cancer (HCC)   Abdominal pain   Scheduled Meds:  gabapentin  600 mg Oral QHS   nicotine  14 mg Transdermal Daily   sodium chloride flush  3 mL Intravenous Q12H   tamsulosin  0.4 mg Oral Daily   Continuous Infusions:  lactated ringers 75 mL/hr at 02/26/22 1224   vancomycin     PRN Meds:.acetaminophen **OR** acetaminophen, bisacodyl, hydrALAZINE, morphine injection, ondansetron **OR** ondansetron (ZOFRAN) IV, oxyCODONE, polyethylene glycol  HPI: Christopher [name]. is a 61 y.o. male with past medical history of hypertension, tonsil cancer s/p chemo/radiation in 2020 in remission, who presents to the hospital for right flank pain.  Patient reports lower back pain started 1 month ago after hearing a pop sound in his back.  He also developed right flank pain which she was admitted to [facility].  The  work-up over there was non revealing.  CT abdomen/pelvis did not show any acute process.  He underwent EGD with colonoscopy which was unremarkable.  CTA showed renal artery thrombosis but otherwise no obvious cause of his flank pain.  He follow-up with his PCP who obtained lumbar x-ray and sent him to orthopedic and urology.  He states that his right flank pain has progressively worsened.  Pain located in the right lower quadrant, just above the iliac crest.  Described pain as stabbing and pulling.  Denies pain with urination or bowel movements.  Pain is worse with movement.  Nothing seems to help with the pain.  Denies midline back pain, lower extremity weakness, urinary or bowel incontinence. Endorses constipation which she has to take MiraLAX.  He denies any skin rash.  No recent oral surgery.  Michela Pitcher that he has bad teeth due to his radiation and was referred to a orthodontist for a micro root canal.  Denies any oral pain or purulent discharge.  States that he has had 3 rounds of chemo and 35 radiation treatment for tonsillar cancer, finished in 2020.  Said that his cancer is in remission.  Patient endorses chills and night sweats.  Report 25 pounds weight loss in the last month due to his flank pain and poor p.o. intake.  Denies coughing, shortness of breath or chest pain.  Patient denies history of smoking, alcohol or drug use.  He does use smokeless tobacco.  Patient reports excruciating pain in his right lower quadrant during examination.  States that Dilaudid helps a little bit.  Review of Systems: ROS Per HPI  Past Medical History:  Diagnosis Date   Prostatitis    Restless leg    Tonsillar cancer ([place]) 09/2019   SCC - 3 chemo, 35 radiation treatments   Past Surgical History:  Procedure Laterality Date   CHOLECYSTECTOMY      Social History   Tobacco Use   Smoking status: Never   Smokeless tobacco: Current    Types: Snuff   Tobacco comments:    Uses nicotine replacement pouch   Substance Use Topics   Alcohol use: Yes    Alcohol/week: 2.0 standard drinks    Types: 2 Cans of beer per week    Comment: rare   Drug use: Not Currently    Family History  Problem Relation Age of Onset   Alcoholism Mother    Alcoholism Father    Allergies  Allergen Reactions   Losartan Cough    OBJECTIVE: Blood pressure 126/72, pulse 74, temperature 98.6 F (37 C), temperature source Oral, resp. rate 18, SpO2 96 %.  Physical Exam Constitutional:      General: He is in acute distress.     Appearance: He is normal weight.  HENT:     Head: Normocephalic.     Mouth/Throat:     Comments: Multiple decayed molars seen.  No obvious purulent discharge. Eyes:     General:        Right eye: No discharge.     Conjunctiva/sclera: Conjunctivae normal.  Cardiovascular:     Rate and Rhythm: Normal rate and regular rhythm.  Pulmonary:     Effort: Pulmonary effort is normal. No respiratory distress.     Breath sounds: Normal breath sounds.  Abdominal:     Comments: No tenderness to palpation, especially in the right lower quadrant.  Bowel sound present.  No guarding.  Abdomen mildly distended but no change of the overlying skin.  Musculoskeletal:     Right lower leg: No edema.     Left lower leg: No edema.     Comments: No midline tenderness.  Tenderness to palpation in the posterior lower quadrant.  No erythema or changes in the overlying skin.  Skin:    General: Skin is warm.  Neurological:     General: No focal deficit present.     Mental Status: He is alert and oriented to person, place, and time.  Psychiatric:        Mood and Affect: Mood normal.    Lab Results Lab Results  Component Value Date   WBC 8.2 02/26/2022   HGB 13.5 02/26/2022   HCT 38.8 (L) 02/26/2022   MCV 85.3 02/26/2022   PLT 246 02/26/2022    Lab Results  Component Value Date   CREATININE 1.05 02/26/2022   BUN 20 02/26/2022   NA 136 02/26/2022   K 3.9 02/26/2022   CL 105 02/26/2022   CO2 22  02/26/2022    Lab Results  Component Value Date   ALT 24 02/26/2022   AST 17 02/26/2022   ALKPHOS 72 02/26/2022   BILITOT 1.4 (H) 02/26/2022     Microbiology: Recent Results (from the past 240 hour(s))  Culture, blood (Routine X 2) w Reflex to ID Panel     Status: None (Preliminary result)   Collection Time: 02/26/22  5:20 AM   Specimen: BLOOD  Result Value Ref Range Status   Specimen Description BLOOD SITE NOT SPECIFIED  Final   Special Requests   Final    BOTTLES DRAWN AEROBIC AND ANAEROBIC Blood Culture adequate volume   Culture   Final    NO GROWTH < 12 HOURS Performed at [facility], [address], [place], [place] [address]    Report Status PENDING  Incomplete  Culture, blood (Routine X 2) w Reflex to ID Panel     Status: None (Preliminary result)   Collection Time: 02/26/22  5:25 AM   Specimen: BLOOD  Result Value Ref Range Status   Specimen Description BLOOD SITE NOT SPECIFIED  Final   Special Requests   Final    BOTTLES DRAWN AEROBIC AND ANAEROBIC Blood Culture adequate volume   Culture   Final    NO GROWTH < 12 HOURS Performed at [facility], [place] [address], [place], [place] [address]    Report Status PENDING  Incomplete   Imaging MR THORACIC SPINE W WO CONTRAST  Result Date: 02/26/2022 CLINICAL DATA:  61 year old male with mid back pain since last month. Anterior endplate erosions at Z00-P23 on CTA suspicious for osteomyelitis. 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 EXAM: MRI THORACIC WITHOUT AND WITH CONTRAST TECHNIQUE: Multiplanar and multiecho pulse sequences of the thoracic spine were obtained without and with intravenous contrast. CONTRAST:  14m GADAVIST GADOBUTROL 1 MMOL/ML IV SOLN COMPARISON:  CTA abdomen and pelvis 0221 hours today. CT Abdomen and Pelvis 01/28/2022. Neck CT 08/18/2020. Thoracic spine radiographs 02/02/2022. FINDINGS: Limited cervical spine imaging: Chronic C5-C6 disc and endplate degeneration grossly  stable since 2021. Thoracic spine segmentation: Evidence of hypoplastic or absent ribs at T12. Alignment: Stable thoracic kyphosis from the radiographs last month. Vertebrae: Confluent abnormal marrow edema and enhancement in the T9 and T10 vertebral bodies, especially anteriorly where CTA this morning demonstrates bulky anterior endplate osteophytes have been partially eroded since the CT on 01/28/2022 (series 34 image 13 of this exam). There is associated prevertebral and bilateral paraspinal soft tissue thickening and enhancement (series 31, image 30), however, the intervening T9-T10 disc space seems to remain relatively normal. No posterior paraspinal soft tissue inflammation. No discrete fluid collection. And the epidural space here also seems to remain normal. There is also faint marrow edema and enhancement in the right superior T12 body on series 29, image 8, and perhaps early T11 anterior inferior endplate edema also. But no other paraspinal soft tissue abnormality. Anterior endplate osteophytes at T7 and T8 appear to remain normal. Background bone marrow signal is normal. Benign posterior element hemangioma on the left at T5. Cord: Normal. Conus medullaris mostly visible at T12-L1. No abnormal intradural enhancement or dural thickening. Paraspinal and other soft tissues: Abnormal ventral and lateral paraspinal soft tissues at T9-T10 detailed above. Negative elsewhere. Visible chest remarkable trick 4 trace layering right pleural effusion and mild nonspecific dependent lung base opacity. Grossly negative visible mediastinum and upper abdominal viscera. Disc levels: No age advanced thoracic disc degeneration. Intermittent thoracic posterior element hypertrophy, including at the T9-T10 level greater on the left (series 30, image 28). But no significant thoracic spinal stenosis. No significant foraminal stenosis. IMPRESSION: 1. Transitional spinal anatomy. Hypoplastic ribs at T12. When counting from the skull  base the abnormal anterior thoracic spinal segment is T9-T10. 2. Confluent abnormal edema and enhancement of the anterior T9-T10 bodies where partial erosion of anterior endplate osteophytes has occurred by  CT since 01/28/2022. Bulky associated ventral and lateral paraspinal soft tissue thickening and enhancement, however, the intervening T9-T10 disc remains relatively normal. Epidural space remains normal. And there is no abscess or fluid collection. Questionable early abnormal T12 vertebral body edema and enhancement. Therefore, differential considerations include: - unusual Osteomyelitis with little to no associated discitis. Recommend blood cultures. - lytic Tumor or Metastasis with extraosseous extension. 3. Mild for age thoracic spine degeneration (posterior element hypertrophy) with no spinal or foraminal stenosis. 4. Trace right pleural effusion. Electronically Signed   By: Genevie Ann M.D.   On: 02/26/2022 07:38   MR Lumbar Spine W Wo Contrast  Result Date: 02/26/2022 CLINICAL DATA:  61 year old male with mid back pain since last month. Anterior endplate erosions at P71-G62 on CTA suspicious for osteomyelitis. 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 EXAM: MRI LUMBAR SPINE WITHOUT AND WITH CONTRAST TECHNIQUE: Multiplanar and multiecho pulse sequences of the lumbar spine were obtained without and with intravenous contrast. CONTRAST:  77m GADAVIST GADOBUTROL 1 MMOL/ML IV SOLN COMPARISON:  Thoracic spine MRI today reported separately. CTA abdomen and pelvis 0221 hours today. FINDINGS: Segmentation: Transitional anatomy, as demonstrated on the thoracic MRI today. Fully sacralized L5 level. Correlation with radiographs is recommended prior to any operative intervention. Alignment: Mild levoconvex lumbar scoliosis. Relatively preserved lumbar lordosis. No significant spondylolisthesis. Vertebrae: Redemonstrated faint abnormal marrow edema and enhancement in the right superior T12 vertebral  body as questioned on the thoracic MRI today (series 7, image 7 and series 2, image 7). But no other marrow edema or enhancement. Background bone marrow signal is within normal limits. Intact visible sacrum and SI joints. Conus medullaris and cauda equina: Conus extends to the T12-L1 level. No lower spinal cord or conus signal abnormality. No abnormal intradural enhancement. No dural thickening. Capacious lumbar spinal canal. Cauda equina nerve roots appear normal. Paraspinal and other soft tissues: Negative. Disc levels: Similar to the thoracic MRI today, lumbar disc degeneration is generally mild for age and there is intermittent lumbar posterior element hypertrophy. No lumbar spinal stenosis. Small central disc protrusion at L4-L5 does not result in lateral recess stenosis. There is mild degenerative neural foraminal stenosis at the left L2, left L3, bilateral L4 nerve levels. L5-S1 is completely sacralized. IMPRESSION: 1. Transitional anatomy with fully sacralized L5 level when numbering from the skull base. Correlation with radiographs is recommended prior to any operative intervention. 2. Confirmed faint abnormal marrow edema and enhancement in the superior T12 vertebral body on the right. See associated abnormalities detailed on Thoracic MRI today. 3. But negative for age MRI appearance of the Lumbar spine. Mild degenerative lumbar neural foraminal stenosis. Electronically Signed   By: HGenevie AnnM.D.   On: 02/26/2022 07:44   CT L-SPINE NO CHARGE  Result Date: 02/26/2022 CLINICAL DATA:  Back pain. EXAM: CT LUMBAR SPINE WITHOUT CONTRAST TECHNIQUE: Multidetector CT imaging of the lumbar spine was performed without intravenous contrast administration. Multiplanar CT image reconstructions were also generated. RADIATION DOSE REDUCTION: This exam was performed according to the departmental dose-optimization program which includes automated exposure control, adjustment of the mA and/or kV according to patient size  and/or use of iterative reconstruction technique. COMPARISON:  CT of abdomen and pelvis no contrast 01/28/2022, CT chest with contrast 07/19/2018. FINDINGS: Segmentation: There are hypoplastic T12 ribs (confirmed on the prior chest CT) and only 4 non-rib-bearing lumbar segments. There is no transitional segment. Alignment: There is mild levoscoliosis. Apex is at L1-2. There is no AP listhesis.  Vertebrae: There are moderate features of spondylosis. The vertebra are normal in heights. No fracture or focal lesion is seen. Paraspinal and other soft tissues: Negative apart from mild aortic atherosclerosis. Disc levels: There is mild disc space loss at L4-S1. Above L4 the discs are normal in heights. Above L3 no significant disc bulge is seen or herniations. There is mild facet spurring at L1-2, moderate asymmetric right facet hypertrophy L2-3, and bilateral moderate facet hypertrophy at L3-4 and L4-S1. There is slight nonstenosing posterior disc bulge at L3-4. At L4-S1 there is left paracentral shallow disc protrusion with overlying annular calcification and slight compression of the left S1 nerve root. Due to facet hypertrophy, at L3-4 there is moderate left foraminal stenosis and at L4-S1 there is moderate right and mild-to-moderate left foraminal stenosis. IMPRESSION: 1. Degenerative changes and mild levoscoliosis without evidence of fractures or focal bone lesion. 2. Shallow chronic left paracentral herniated disc L4-S1 with mild compression of the left S1 nerve root. 3. 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 4. CTA abdomen and pelvis today demonstrated peridiscal endplate erosions and fragmentation anteriorly at T9-10, above the level of the current study. Findings consistent with spondylodiscitis. Electronically Signed   By: Telford Nab M.D.   On: 02/26/2022 03:32   CT Angio Abd/Pel w/ and/or w/o  Addendum Date: 02/26/2022   ADDENDUM REPORT: 02/26/2022 03:34  ADDENDUM: The patient only has 4 lumbar segments with hypoplastic ribs at T12. This is confirmed on prior chest CT from 2019. The anterior endplate erosions described in this report are therefore actually at T9-10, not at T10-11. Electronically Signed   By: Telford Nab M.D.   On: 02/26/2022 03:34   Result Date: 02/26/2022 CLINICAL DATA:  Renal artery stenosis. Recent right renal artery thrombus. Presents with right abdominal pain. EXAM: CTA ABDOMEN AND PELVIS WITHOUT AND WITH CONTRAST TECHNIQUE: Multidetector CT imaging of the abdomen and pelvis was performed using the standard protocol during bolus administration of intravenous contrast. Multiplanar reconstructed images and MIPs were obtained and reviewed to evaluate the vascular anatomy. RADIATION DOSE REDUCTION: This exam was performed according to the departmental dose-optimization program which includes automated exposure control, adjustment of the mA and/or kV according to patient size and/or use of iterative reconstruction technique. CONTRAST:  166m OMNIPAQUE IOHEXOL 350 MG/ML SOLN COMPARISON:  CTA abdomen and pelvis and reconstructions of 01/23/2022, CT abdomen and pelvis no contrast 01/28/2022. FINDINGS: VASCULAR Aorta: Small amount of scattered mixed plaque. No aneurysm, dissection or stenosis. Celiac: Patent without evidence of aneurysm, dissection, vasculitis or significant stenosis. There are Trace ostial calcifications which are nonstenosing. SMA: Patent without evidence of aneurysm, dissection, vasculitis or significant stenosis. Renals: Both renal arteries are patent without evidence of aneurysm, dissection, vasculitis, fibromuscular dysplasia or significant stenosis. Left renal artery is single. On the right there is a hypoplastic artery to the lower pole arising just above the main artery. Right renal artery thrombus is no longer seen. IMA: Patent without evidence of aneurysm, dissection, vasculitis or significant stenosis. Inflow: Patent  without evidence of aneurysm, dissection, vasculitis or significant stenosis. There is minimal calcification in the distal left common iliac artery. No other iliac arterial calcific plaques are seen. Proximal Outflow: Bilateral common femoral and visualized portions of the superficial and profunda femoral arteries are patent without evidence of aneurysm, dissection, vasculitis or significant stenosis. Veins: Clear. Review of the MIP images confirms the above findings. NON-VASCULAR Lower chest: No acute abnormality. There is a trace right pleural  effusion. Subsegmental atelectasis both bases. The cardiac size is normal. Hepatobiliary: There is a mildly steatotic and otherwise unremarkable liver. The gallbladder is absent without biliary dilatation. Pancreas: Partial fatty replacement in the head and proximal neck. No mass enhancement or ductal dilatation. Spleen: No mass enhancement. Mild splenomegaly with splenic AP diameter 14.6 cm Adrenals/Urinary Tract: Chronic perinephric stranding. No adrenal mass or renal mass enhancement. No calculus or hydronephrosis. Unremarkable bladder. Stomach/Bowel: No dilatation or wall thickening. An appendix is not seen. There is fluid throughout the colon but no inflammatory changes. Scattered uncomplicated colonic diverticula. Lymphatic: No adenopathy is seen. Reproductive: There is no prostatomegaly. Other: Small umbilical and inguinal fat hernias. There is no incarcerated hernia. There is no free air, hemorrhage or fluid. Musculoskeletal: There is new demonstration of peridiscal endplate erosions and bony fragmentation anteriorly at T10-11 and adjacent paraspinal soft tissue stranding most likely due to phlegmon. No paraspinal abscess is seen. Findings strongly suspect spondylodiscitis. There are degenerative changes in the thoracic and lumbar spine without further new abnormality. There is mild lumbar levoscoliosis apex at L2. There is mild hip DJD. IMPRESSION: VASCULAR 1.  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 2. Scattered aortic atherosclerosis but no stenosis, aneurysm or dissection in the aorta and branch arteries. NON-VASCULAR 1. Fluid in the colon but no wall thickening or inflammatory changes. Findings may suggest a nonspecific diarrheal illness. 2. Interval new finding of peridiscal endplate erosions anteriorly with bony fragmentation at T10-11 with adjacent soft tissue reaction most likely due to phlegmon. 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 No epidural extension of the process is suspected at this time. 3. Trace right pleural effusion. 4. Mildly prominent spleen. 5. Small umbilical and inguinal fat hernias. 6. Additional findings described above. Electronically Signed: By: Telford Nab M.D. On: 02/26/2022 03:10     Gaylan Gerold, MD [facility]  02/26/2022, 1:08 PM

## 2022-02-26 NOTE — H&P (Signed)
History and Physical    Patient: Christopher [name]. TXH:[id number] DOB: [birth year] DOA: 02/26/2022 DOS: the patient was seen and examined on 02/26/2022 PCP: Patient, No Pcp Per (Inactive)  Patient coming from: Home - lives with wife; Donald Prose: Wife, [phone]   Chief Complaint: Flank pain  HPI: Christopher [name]. is a 61 y.o. male with medical history significant of prostatitis and restless leg syndrome presenting with flank pain.  He started having having pain in his back and sides.  No solid BM in a month, drinking Miralax.  He thought he was better but ate a big steak Wednesday night and things got worse.  The pain is just above his R iliac crest and throbs and aches and takes his breath.  Nothing makes it better or worse.  Pain meds in the ER have helped some but maybe aren't strong enough.  It is better than the Tramadol he took at home.  No prior h/o similar.  He feels like he grabbed something wrong and pulled something - he grabbed a big piece of steel - 30 minutes later he had sore throat and a headache and later that day he developed a fever to 102 and chills and a few days later he developed back pain.  He gets night sweats a lot.  He was having pain across his entire back and went to [facility] and "they had no clue".  They did multiple scans, [place], xrays, and a colonoscopy (not completely cleared out) with UGI.  They thought maybe he had a blockage and wanted to give him a chance to clear out and let it rest.  No urinary symptoms.  No fecal incontinence - but he did have a lot of Miralax yesterday and this has led to recurrent stools.  25 pound unintentional weight loss.  On further discussion, he does acknowledge significant decay in his left upper molars following prior radiation treatment.  He was referred from his dentist in [place] (had a panorex then) to [facility] in [place] and was seen there prior to the onset of his current symptoms.  He  reports that he was supposed to be referred to another oral surgeon in [place] who can do a "micro root canal" but has not heard back.  I called and spoke with the oral surgery office.  They saw him in March.  Panorex was done 2/23.  Numbers 18 and 19 are the issue.  Referred to endodontist Dr. Modena Nunnery, needs root canal and then dental extractions (coronectomy).   [facility] does not have dental or oral surgery coverage today.  I called to speak with Dr. Raina Mina.      ER Course:  Carryover, per Dr. Velia Meyer:  Approximate 1 month ago, the patient was lifting a heavy object at which time he heard a popping sound, which she felt localized to his low to mid back, associated with development of acute right flank discomfort, which is subsequently persisted over the course of the last month.  In the setting of this right flank discomfort, he was recently hospitalized at [facility] hospital where he reportedly underwent extensive evaluation, including finding of renal artery thrombosis.  He was subsequently discharged to home, noting that he was still experiencing the right flank discomfort, although it was improved over the intensity associated with the right flank with which she had originally presented.  However, over the last 2 days, he has noted intensification of his right flank discomfort.  Not associated with any saddle anesthesia, urinary retention, or bowel incontinence.  No reported history of IV drug abuse.   CT scan suggestive of spondylodiscitis.  Dr. Sedonia Small discussed with neurosurgery, who recommended initiation of IV antibiotics, consultation of infectious disease, and pursuit of MRI of the thoracic/lumbar spine to further evaluate for cord impingement/foraminal stenosis that may be posing additional contribution to his presenting pain, but otherwise did not feel that there was an indication for additional neurosurgical involvement at this time.   MRI of the thoracic and lumbar spine  have been ordered, with results currently pending.  He has received single doses of IV vancomycin in Zos[place].  Will need additional orders for subsequent antibiotics.  In review of old records: -He was seen yesterday by [facility].  He had been hospitalized at [facility] on 4/15 with hematemesis and there was concern for SBO.  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  He had an EGD during that hospitalization with several ulcers thought to be related to NG tube trauma.  He also had a colonoscopy with poor prep but unremarkable findings.  He was scheduled yesterday for stat CT A/P with contrast (enterography). -His last visit with rad onc was on 10/23/21 and he had no evidence of recurrence on scope exam.  He is to h/o in 04/2022.      Review of Systems: As mentioned in the history of present illness. All other systems reviewed and are negative. 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 His smokeless tobacco use includes snuff. He reports current alcohol use of about 2.0 standard drinks per week. He reports that he does not currently use drugs.  Allergies  Allergen Reactions   Losartan Cough    Family History  Problem Relation Age of Onset   Alcoholism Mother    Alcoholism Father     Prior to Admission medications   Medication Sig Start Date End Date Taking? Authorizing Provider  diclofenac (VOLTAREN) 75 MG EC tablet Take 75 mg by mouth 2 (two) times daily. 02/17/22  Yes [provider]  gabapentin (NEURONTIN) 600 MG tablet Take 600 mg by mouth at bedtime.   Yes [provider]  tamsulosin (FLOMAX) 0.4 MG CAPS capsule Take 0.4 mg by mouth daily.    Yes [provider]  traMADol (ULTRAM) 50 MG tablet Take 50-100 mg by mouth every 6  (six) hours as needed for moderate pain. 01/20/22  Yes [provider]    Physical Exam: Vitals:   02/26/22 0400 02/26/22 0753 02/26/22 0900 02/26/22 1201  BP: 121/85 108/75 103/70 126/72  Pulse: 63 83 82 74  Resp:  16  18  Temp:  98.4 F (36.9 C)  98.6 F (37 C)  TempSrc:  Oral  Oral  SpO2: 99% 96% 97% 96%   General:  Appears calm but uncomfortable, frustrated Eyes:   EOMI, normal lids, iris ENT:  grossly normal hearing, lips & tongue, mmm; poor dentition in left upper back primarily (left lower teeth also appear to have decay) Neck:  no LAD, masses or thyromegaly Cardiovascular:  RRR, no m/r/g. No LE edema.  Respiratory:   CTA bilaterally with no wheezes/rales/rhonchi.  Normal respiratory effort. Abdomen:  soft, mildly  diffusely tender, ND Back:   R flank pain Skin:  no rash or induration seen on limited exam Musculoskeletal:  grossly normal tone BUE/BLE, good ROM, no bony abnormality Psychiatric:  blunted mood and affect, speech fluent and appropriate, AOx3 Neurologic:  CN 2-12 grossly intact, moves all extremities in coordinated fashion   Radiological Exams on Admission: Independently reviewed - see discussion in A/P where applicable  MR THORACIC SPINE W WO CONTRAST  Result Date: 02/26/2022 CLINICAL DATA:  61 year old male with mid back pain since last month. Anterior endplate erosions at I96-V89 on CTA suspicious for osteomyelitis. 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 EXAM: MRI THORACIC WITHOUT AND WITH CONTRAST TECHNIQUE: Multiplanar and multiecho pulse sequences of the thoracic spine were obtained without and with intravenous contrast. CONTRAST:  58m GADAVIST GADOBUTROL 1 MMOL/ML IV SOLN COMPARISON:  CTA abdomen and pelvis 0221 hours today. CT Abdomen and Pelvis 01/28/2022. Neck CT 08/18/2020. Thoracic spine radiographs 02/02/2022. FINDINGS: Limited cervical spine imaging: Chronic C5-C6 disc and endplate degeneration grossly stable since 2021.  Thoracic spine segmentation: Evidence of hypoplastic or absent ribs at T12. Alignment: Stable thoracic kyphosis from the radiographs last month. Vertebrae: Confluent abnormal marrow edema and enhancement in the T9 and T10 vertebral bodies, especially anteriorly where CTA this morning demonstrates bulky anterior endplate osteophytes have been partially eroded since the CT on 01/28/2022 (series 34 image 13 of this exam). There is associated prevertebral and bilateral paraspinal soft tissue thickening and enhancement (series 31, image 30), however, the intervening T9-T10 disc space seems to remain relatively normal. No posterior paraspinal soft tissue inflammation. No discrete fluid collection. And the epidural space here also seems to remain normal. There is also faint marrow edema and enhancement in the right superior T12 body on series 29, image 8, and perhaps early T11 anterior inferior endplate edema also. But no other paraspinal soft tissue abnormality. Anterior endplate osteophytes at T7 and T8 appear to remain normal. Background bone marrow signal is normal. Benign posterior element hemangioma on the left at T5. Cord: Normal. Conus medullaris mostly visible at T12-L1. No abnormal intradural enhancement or dural thickening. Paraspinal and other soft tissues: Abnormal ventral and lateral paraspinal soft tissues at T9-T10 detailed above. Negative elsewhere. Visible chest remarkable trick 4 trace layering right pleural effusion and mild nonspecific dependent lung base opacity. Grossly negative visible mediastinum and upper abdominal viscera. Disc levels: No age advanced thoracic disc degeneration. Intermittent thoracic posterior element hypertrophy, including at the T9-T10 level greater on the left (series 30, image 28). But no significant thoracic spinal stenosis. No significant foraminal stenosis. IMPRESSION: 1. Transitional spinal anatomy. Hypoplastic ribs at T12. When counting from the skull base the abnormal  anterior thoracic spinal segment is T9-T10. 2. Confluent abnormal edema and enhancement of the anterior T9-T10 bodies where partial erosion of anterior endplate osteophytes has occurred by CT since 01/28/2022. Bulky associated ventral and lateral paraspinal soft tissue thickening and enhancement, however, the intervening T9-T10 disc remains relatively normal. Epidural space remains normal. And there is no abscess or fluid collection. Questionable early abnormal T12 vertebral body edema and enhancement. Therefore, differential considerations include: - unusual Osteomyelitis with little to no associated discitis. Recommend blood cultures. - lytic Tumor or Metastasis with extraosseous extension. 3. Mild for age thoracic spine degeneration (posterior element hypertrophy) with no spinal or foraminal stenosis. 4. Trace right pleural effusion. Electronically Signed   By: HGenevie AnnM.D.   On: 02/26/2022 07:38   MR Lumbar Spine W Wo Contrast  Result Date: 02/26/2022 CLINICAL DATA:  61 year old male with mid back pain since last month. Anterior endplate erosions at Z61-W96 on CTA suspicious for osteomyelitis. 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 EXAM: MRI LUMBAR SPINE WITHOUT AND WITH CONTRAST TECHNIQUE: Multiplanar and multiecho pulse sequences of the lumbar spine were obtained without and with intravenous contrast. CONTRAST:  71m GADAVIST GADOBUTROL 1 MMOL/ML IV SOLN COMPARISON:  Thoracic spine MRI today reported separately. CTA abdomen and pelvis 0221 hours today. FINDINGS: Segmentation: Transitional anatomy, as demonstrated on the thoracic MRI today. Fully sacralized L5 level. Correlation with radiographs is recommended prior to any operative intervention. Alignment: Mild levoconvex lumbar scoliosis. Relatively preserved lumbar lordosis. No significant spondylolisthesis. Vertebrae: Redemonstrated faint abnormal marrow edema and enhancement in the right superior T12 vertebral body as questioned on  the thoracic MRI today (series 7, image 7 and series 2, image 7). But no other marrow edema or enhancement. Background bone marrow signal is within normal limits. Intact visible sacrum and SI joints. Conus medullaris and cauda equina: Conus extends to the T12-L1 level. No lower spinal cord or conus signal abnormality. No abnormal intradural enhancement. No dural thickening. Capacious lumbar spinal canal. Cauda equina nerve roots appear normal. Paraspinal and other soft tissues: Negative. Disc levels: Similar to the thoracic MRI today, lumbar disc degeneration is generally mild for age and there is intermittent lumbar posterior element hypertrophy. No lumbar spinal stenosis. Small central disc protrusion at L4-L5 does not result in lateral recess stenosis. There is mild degenerative neural foraminal stenosis at the left L2, left L3, bilateral L4 nerve levels. L5-S1 is completely sacralized. IMPRESSION: 1. Transitional anatomy with fully sacralized L5 level when numbering from the skull base. Correlation with radiographs is recommended prior to any operative intervention. 2. Confirmed faint abnormal marrow edema and enhancement in the superior T12 vertebral body on the right. See associated abnormalities detailed on Thoracic MRI today. 3. But negative for age MRI appearance of the Lumbar spine. Mild degenerative lumbar neural foraminal stenosis. Electronically Signed   By: HGenevie AnnM.D.   On: 02/26/2022 07:44   CT L-SPINE NO CHARGE  Result Date: 02/26/2022 CLINICAL DATA:  Back pain. EXAM: CT LUMBAR SPINE WITHOUT CONTRAST TECHNIQUE: Multidetector CT imaging of the lumbar spine was performed without intravenous contrast administration. Multiplanar CT image reconstructions were also generated. RADIATION DOSE REDUCTION: This exam was performed according to the departmental dose-optimization program which includes automated exposure control, adjustment of the mA and/or kV according to patient size and/or use of  iterative reconstruction technique. COMPARISON:  CT of abdomen and pelvis no contrast 01/28/2022, CT chest with contrast 07/19/2018. FINDINGS: Segmentation: There are hypoplastic T12 ribs (confirmed on the prior chest CT) and only 4 non-rib-bearing lumbar segments. There is no transitional segment. Alignment: There is mild levoscoliosis. Apex is at L1-2. There is no AP listhesis. Vertebrae: There are moderate features of spondylosis. The vertebra are normal in heights. No fracture or focal lesion is seen. Paraspinal and other soft tissues: Negative apart from mild aortic atherosclerosis. Disc levels: There is mild disc space loss at L4-S1. Above L4 the discs are normal in heights. Above L3 no significant disc bulge is seen or herniations. There is mild facet spurring at L1-2, moderate asymmetric right facet hypertrophy L2-3, and bilateral moderate facet hypertrophy at L3-4 and L4-S1. There is slight nonstenosing posterior disc bulge at L3-4. At L4-S1 there is left paracentral shallow disc protrusion with overlying annular calcification and slight compression of the left S1 nerve  root. Due to facet hypertrophy, at L3-4 there is moderate left foraminal stenosis and at L4-S1 there is moderate right and mild-to-moderate left foraminal stenosis. IMPRESSION: 1. Degenerative changes and mild levoscoliosis without evidence of fractures or focal bone lesion. 2. Shallow chronic left paracentral herniated disc L4-S1 with mild compression of the left S1 nerve root. 3. 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 4. CTA abdomen and pelvis today demonstrated peridiscal endplate erosions and fragmentation anteriorly at T9-10, above the level of the current study. Findings consistent with spondylodiscitis. Electronically Signed   By: Telford Nab M.D.   On: 02/26/2022 03:32   CT Angio Abd/Pel w/ and/or w/o  Addendum Date: 02/26/2022   ADDENDUM REPORT: 02/26/2022 03:34 ADDENDUM: The  patient only has 4 lumbar segments with hypoplastic ribs at T12. This is confirmed on prior chest CT from 2019. The anterior endplate erosions described in this report are therefore actually at T9-10, not at T10-11. Electronically Signed   By: Telford Nab M.D.   On: 02/26/2022 03:34   Result Date: 02/26/2022 CLINICAL DATA:  Renal artery stenosis. Recent right renal artery thrombus. Presents with right abdominal pain. EXAM: CTA ABDOMEN AND PELVIS WITHOUT AND WITH CONTRAST TECHNIQUE: Multidetector CT imaging of the abdomen and pelvis was performed using the standard protocol during bolus administration of intravenous contrast. Multiplanar reconstructed images and MIPs were obtained and reviewed to evaluate the vascular anatomy. RADIATION DOSE REDUCTION: This exam was performed according to the departmental dose-optimization program which includes automated exposure control, adjustment of the mA and/or kV according to patient size and/or use of iterative reconstruction technique. CONTRAST:  125m OMNIPAQUE IOHEXOL 350 MG/ML SOLN COMPARISON:  CTA abdomen and pelvis and reconstructions of 01/23/2022, CT abdomen and pelvis no contrast 01/28/2022. FINDINGS: VASCULAR Aorta: Small amount of scattered mixed plaque. No aneurysm, dissection or stenosis. Celiac: Patent without evidence of aneurysm, dissection, vasculitis or significant stenosis. There are Trace ostial calcifications which are nonstenosing. SMA: Patent without evidence of aneurysm, dissection, vasculitis or significant stenosis. Renals: Both renal arteries are patent without evidence of aneurysm, dissection, vasculitis, fibromuscular dysplasia or significant stenosis. Left renal artery is single. On the right there is a hypoplastic artery to the lower pole arising just above the main artery. Right renal artery thrombus is no longer seen. IMA: Patent without evidence of aneurysm, dissection, vasculitis or significant stenosis. Inflow: Patent without evidence  of aneurysm, dissection, vasculitis or significant stenosis. There is minimal calcification in the distal left common iliac artery. No other iliac arterial calcific plaques are seen. Proximal Outflow: Bilateral common femoral and visualized portions of the superficial and profunda femoral arteries are patent without evidence of aneurysm, dissection, vasculitis or significant stenosis. Veins: Clear. Review of the MIP images confirms the above findings. NON-VASCULAR Lower chest: No acute abnormality. There is a trace right pleural effusion. Subsegmental atelectasis both bases. The cardiac size is normal. Hepatobiliary: There is a mildly steatotic and otherwise unremarkable liver. The gallbladder is absent without biliary dilatation. Pancreas: Partial fatty replacement in the head and proximal neck. No mass enhancement or ductal dilatation. Spleen: No mass enhancement. Mild splenomegaly with splenic AP diameter 14.6 cm Adrenals/Urinary Tract: Chronic perinephric stranding. No adrenal mass or renal mass enhancement. No calculus or hydronephrosis. Unremarkable bladder. Stomach/Bowel: No dilatation or wall thickening. An appendix is not seen. There is fluid throughout the colon but no inflammatory changes. Scattered uncomplicated colonic diverticula. Lymphatic: No adenopathy is seen. Reproductive: There is no prostatomegaly. Other: Small umbilical  and inguinal fat hernias. There is no incarcerated hernia. There is no free air, hemorrhage or fluid. Musculoskeletal: There is new demonstration of peridiscal endplate erosions and bony fragmentation anteriorly at T10-11 and adjacent paraspinal soft tissue stranding most likely due to phlegmon. No paraspinal abscess is seen. Findings strongly suspect spondylodiscitis. There are degenerative changes in the thoracic and lumbar spine without further new abnormality. There is mild lumbar levoscoliosis apex at L2. There is mild hip DJD. IMPRESSION: VASCULAR 1. Interval resolution of  right renal artery thrombosis. 2. Scattered aortic atherosclerosis but no stenosis, aneurysm or dissection in the aorta and branch arteries. NON-VASCULAR 1. Fluid in the colon but no wall thickening or inflammatory changes. Findings may suggest a nonspecific diarrheal illness. 2. Interval new finding of peridiscal endplate erosions anteriorly with bony fragmentation at T10-11 with adjacent soft tissue reaction most likely due to phlegmon. 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 No epidural extension of the process is suspected at this time. 3. Trace right pleural effusion. 4. Mildly prominent spleen. 5. Small umbilical and inguinal fat hernias. 6. Additional findings described above. Electronically Signed: By: Telford Nab M.D. On: 02/26/2022 03:10    EKG: not done   Labs on Admission: I have personally reviewed the available labs and imaging studies at the time of the admission.  Pertinent labs:    Glucose 117 CRP 5.4 ESR 77 WBC 8.2 UA: small LE, 20 ketones Blood cultures pending   Assessment and Plan: Principal Problem:   Back pain Active Problems:   Tonsillar cancer (HCC)   Abdominal pain   Poor dentition   Back/R flank pain -Patient with 15-monthh/o lumbar pain with radiation to B flanks previously, although currently in L flank -History could be suspicious for infection (fevers, acute onset after injury/URI) vs. Malignancy (h/o tonsillar cancer, night sweats, unexplained weight loss) -Labs unremarkable other than generally mild inflammatory marker elevation -Initial imaging with concern for spondylodiscitis at T9-10 -MRI with T9-10 edema and vertebral enhancement with paraspinal soft tissue thickening but without discitis as well as ?early abnormal T12 vertebral body edema and enhancement - atypical oesto vs. Lytic tumor or mets with extraosseous extension -Neurosurgery is consulting - thinks infection, recommends culturing disc space, ID consult, antibiotics; will  need repeat MRI in about 6 weeks, will see -ID is also consulting -For now, continue Vanc monotherapy -If infectious, will need antibiotics for 4-6 weeks and so is likely to need a PICC line. -Will add testing for TB (quantiferon gold). -Further important considerations include nutrition, diabetes control (no known h/o diabetes), tobacco cessation. -Pain control with oxy, morphine for now.  Abdominal pain -Fluid noted in the colon on imaging, but he also reports taking a large amount of Miralax yesterday -Nonspecific findings, could be related to either infection or malignancy or other -He did have recent upper and lower endoscopies -Will not order GI consult for now  Poor dentition -I spoke with Dr. SConley Simmonds(not on call) -Dental extraction increases the risk of osteonecrosis of the jaw so would have root canal and coronectomy to avoid extraction -Likely needs to continue with the plan for outpatient treatment unless acute worsening occurs -IV antibiotics are appropriate  H/o tonsillar cancer -No surgical resection -Completed chemo/rads -No evidence of recurrence at last check  Tobacco dependence -Long-term oral tobacco user -He is currently using nicotine replacement products -Will order patch -Encourage complete cessation    Advance Care Planning:   Code Status: Full Code   Consults: Neurosurgery; ID; oral surgery  by telephone only  DVT Prophylaxis: SCDs  Family Communication: Wife was present throughout evaluation  Severity of Illness: The appropriate patient status for this patient is INPATIENT. Inpatient status is judged to be reasonable and necessary in order to provide the required intensity of service to ensure the patient's safety. The patient's presenting symptoms, physical exam findings, and initial radiographic and laboratory data in the context of their chronic comorbidities is felt to place them at high risk for further clinical deterioration. Furthermore, it  is not anticipated that the patient will be medically stable for discharge from the hospital within 2 midnights of admission.   * I certify that at the point of admission it is my clinical judgment that the patient will require inpatient hospital care spanning beyond 2 midnights from the point of admission due to high intensity of service, high risk for further deterioration and high frequency of surveillance required.*  Author: Karmen Bongo, MD 02/26/2022 1:15 PM  For on call review [email or web address].

## 2022-02-26 NOTE — ED Triage Notes (Signed)
Patient reports chronic right abdominal pain with constipation for several months , denies emesis or diarrhea , no fever or chills .

## 2022-02-26 NOTE — Progress Notes (Signed)
Pharmacy Antibiotic Note  Christopher [name]. is a 61 y.o. 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  Pharmacy has been consulted for Vancomycin dosing.  Plan: Vancomycin 2000 mg IV now, then Vancomycin 1500 mg IV q12h    Temp (24hrs), Avg:98.5 F (36.9 C), Min:98.5 F (36.9 C), Max:98.5 F (36.9 C)  Recent Labs  Lab 02/26/22 0015  WBC 8.2  CREATININE 1.05    CrCl cannot be calculated (Unknown ideal weight.).    Allergies  Allergen Reactions   Losartan Cough    [name] 02/26/2022 6:01 AM

## 2022-02-26 NOTE — Progress Notes (Signed)
Carryover admission to the Day Admitter.  I discussed this case with the EDP, Dr. Sedonia Small.  Per these discussions:  This is a 61 year old male who is being admitted for further evaluation and management of spondylodiscitis after presenting with approximately 1 month of right flank pain.  Approximate 1 month ago, the patient was lifting a heavy object at which time he heard a popping sound, which she felt localized to his low to mid back, associated with development of acute right flank discomfort, which is subsequently persisted over the course of the last month.  In the setting of this right flank discomfort, he was recently hospitalized at [facility] hospital where he reportedly underwent extensive evaluation, including finding of renal artery thrombosis.  He was subsequently discharged to home, noting that he was still experiencing the right flank discomfort, although it was improved over the intensity associated with the right flank with which she had originally presented.  However, over the last 2 days, he has noted intensification of his right flank discomfort, prompting him to present to [facility] emergency department this evening for further evaluation.  Not associate with any saddle anesthesia, urinary retention, or bowel incontinence.  No reported history of IV drug abuse.  Ensuing CT scan was reportedly suggestive of spondylodiscitis.  Dr. Maurie Boettcher discussed the patient's case/imaging with on-call neurosurgery, who recommended initiation of IV antibiotics, consultation of infectious disease, and pursuit of MRI of the thoracic/lumbar spine to further evaluate for cord impingement/foraminal stenosis that may be posing additional contribution to his presenting pain, but otherwise did not feel that there was an indication for additional neurosurgical involvement at this time.  MRI of the thoracic and lumbar spine have been ordered, with results currently pending.  He has received single doses of  IV vancomycin in Zosyn.  Will need additional orders for subsequent antibiotics.  I have placed an order for inpatient admission to med telemetry for further evaluation and management of spondylodiscitis.   I have placed some additional preliminary admit orders via the adult multi-morbid admission order set. I have also ordered prn IV Dilaudid, Zofran.  I placed orders to add on ESR and CRP levels, and have asked that blood cultures times to be collected, while noting that the patient may have already received the aforementioned IV vancomycin/Zosyn.    Babs Bertin, DO Hospitalist

## 2022-02-27 DIAGNOSIS — M545 Low back pain, unspecified: Secondary | ICD-10-CM | POA: Diagnosis not present

## 2022-02-27 DIAGNOSIS — R1084 Generalized abdominal pain: Secondary | ICD-10-CM | POA: Diagnosis not present

## 2022-02-27 DIAGNOSIS — K089 Disorder of teeth and supporting structures, unspecified: Secondary | ICD-10-CM | POA: Diagnosis not present

## 2022-02-27 DIAGNOSIS — C099 Malignant neoplasm of tonsil, unspecified: Secondary | ICD-10-CM | POA: Diagnosis not present

## 2022-02-27 LAB — CBC
HCT: 35.3 % — ABNORMAL LOW (ref 39.0–52.0)
Hemoglobin: 12.1 g/dL — ABNORMAL LOW (ref 13.0–17.0)
MCH: 29.2 pg (ref 26.0–34.0)
MCHC: 34.3 g/dL (ref 30.0–36.0)
MCV: 85.3 fL (ref 80.0–100.0)
Platelets: 220 10*3/uL (ref 150–400)
RBC: 4.14 MIL/uL — ABNORMAL LOW (ref 4.22–5.81)
RDW: 12.3 % (ref 11.5–15.5)
WBC: 6.7 10*3/uL (ref 4.0–10.5)
nRBC: 0 % (ref 0.0–0.2)

## 2022-02-27 LAB — BASIC METABOLIC PANEL
Anion gap: 8 (ref 5–15)
BUN: 13 mg/dL (ref 6–20)
CO2: 25 mmol/L (ref 22–32)
Calcium: 9.1 mg/dL (ref 8.9–10.3)
Chloride: 106 mmol/L (ref 98–111)
Creatinine, Ser: 0.95 mg/dL (ref 0.61–1.24)
GFR, Estimated: >60 mL/min (ref >60)
Glucose, Bld: 87 mg/dL (ref 70–99)
Potassium: 3.8 mmol/L (ref 3.5–5.1)
Sodium: 139 mmol/L (ref 135–145)

## 2022-02-27 LAB — SURGICAL PCR SCREEN
MRSA, PCR: NEGATIVE
Staphylococcus aureus: NEGATIVE

## 2022-02-27 NOTE — Progress Notes (Signed)
PROGRESS NOTE    Martha Clan Manuela Neptune.  HUT:[id number] DOB: Oct 29, 1960 DOA: 02/26/2022 PCP: Patient, No Pcp Per (Inactive)   Brief Narrative:  Pt is a 61 y.o. male with somewhat complex and convoluted history who presents with chief complaint of right flank/paraspinal muscle pain.  He has been previously evaluated at [facility] as well as [facility] for multiple medical complaints including abdominal pain, osteoarthritis dysphagia, odynophagia, intractable abdominal pain with questionable partial obstruction as well as history of diverticulosis/diverticulitis and cholecystitis.  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  Discussed with neurosurgery for possible biopsy of this area on Monday, 03/01/2022 and pain control in the interim.   Assessment & Plan:   Principal Problem:   Back pain Active Problems:   Tonsillar cancer (HCC)   Abdominal pain   Poor dentition   Intractable back/R flank pain Concurrent ambulatory dysfunction -Unclear etiology concern for infectious process versus malignancy or autoimmune, biopsy pending 03/01/2022 with IR per neurosurgery and ID recommendations -ID consulted for further delineation, discontinued antibiotics given patient does not meet sepsis criteria and imaging is unremarkable for any overt abscesses/large infectious areas -Multiple cultures pending including TB testing given recent weight loss although likely secondary to unspecified GI problems as listed below. -Pain well controlled on current regimen   Chronic abdominal pain/odynophagia -Patient indicates multiple evaluations in the outpatient and inpatient setting previously with GI, most recent endoscopy unremarkable. -Patient symptoms appear to be  sporadically postprandial by at least 12 if not 20 hours with " kidney stone like" pain/symptoms.  -Given his symptoms appear to be sporadic and not postprandial after every meal, every fatty meal or after alcohol it is less likely gallbladder or pancreas in relation-History of gallbladder removal -Concern patient may have a partial stricture at some point in his small bowel that becomes at least partially or completely obstructed transiently after a large meal which causes his symptoms.  Discussed needing to discontinue red meat and work on transitioning to a soft diet as tolerated over the next few weeks. -No indication for GI consult at this time   Poor dentition -Previous physician spoke with Dr. Conley Simmonds (not on call), appreciate insight and recommendations -Likely needs to continue with the plan for outpatient treatment unless acute worsening occurs -As above lengthy discussion on need for dietary changes, patient continues to consume high sugar carbonated beverages   H/o tonsillar cancer -No surgical resection -Completed chemo/rads -No evidence of recurrence at last check   Tobacco dependence -Long-term oral tobacco user -He is currently using nicotine replacement products -Continue patch as tolerated   DVT prophylaxis: Early ambulation, SCDs Code Status: Full Family Communication: Daughter and wife at bedside  Status is: Inpatient  Dispo: The patient is from: Home              Anticipated d/c is to: To be determined              Anticipated d/c date is: 72-96h              Patient currently not medically stable for discharge  Consultants:  Neurosurgery, IR, infectious disease  Procedures:  Biopsy pending 03/01/2022  Antimicrobials:  Holding as above  Subjective: No acute issues or events overnight pain currently well controlled on current regimen denies nausea vomiting diarrhea constipation headache fevers chills  or chest pain  Objective: Vitals:   02/26/22 1607  02/26/22 1800 02/26/22 1951 02/27/22 0404  BP: (!) 147/77  (!) 141/74 109/61  Pulse: 83  97 77  Resp: '18  18 14  '$ Temp: 98.2 F (36.8 C)  97.8 F (36.6 C) 99.6 F (37.6 C)  TempSrc: Oral     SpO2: 99%  93% 95%  Weight:  92.2 kg    Height:  '6\' 1"'$  (1.854 m)      Intake/Output Summary (Last 24 hours) at 02/27/2022 0701 Last data filed at 02/27/2022 0600 Gross per 24 hour  Intake 1285.23 ml  Output --  Net 1285.23 ml   Filed Weights   02/26/22 1800  Weight: 92.2 kg    Examination:  General:  Pleasantly resting in bed, No acute distress. HEENT:  Normocephalic atraumatic.  Sclerae nonicteric, noninjected.  Extraocular movements intact bilaterally. Neck:  Without mass or deformity.  Trachea is midline. Lungs:  Clear to auscultate bilaterally without rhonchi, wheeze, or rales. Heart:  Regular rate and rhythm.  Without murmurs, rubs, or gallops. Abdomen:  Soft, nontender, nondistended.  Without guarding or rebound. Extremities: Without cyanosis, clubbing, edema, or obvious deformity. Vascular:  Dorsalis pedis and posterior tibial pulses palpable bilaterally. Skin:  Warm and dry, no erythema, no ulcerations.  Data Reviewed: I have personally reviewed following labs and imaging studies  CBC: Recent Labs  Lab 02/26/22 0015 02/27/22 0121  WBC 8.2 6.7  HGB 13.5 12.1*  HCT 38.8* 35.3*  MCV 85.3 85.3  PLT 246 981   Basic Metabolic Panel: Recent Labs  Lab 02/26/22 0015 02/27/22 0121  NA 136 139  K 3.9 3.8  CL 105 106  CO2 22 25  GLUCOSE 117* 87  BUN 20 13  CREATININE 1.05 0.95  CALCIUM 9.5 9.1   GFR: Estimated Creatinine Clearance: 94.6 mL/min (by C-G formula based on SCr of 0.95 mg/dL). Liver Function Tests: Recent Labs  Lab 02/26/22 0015  AST 17  ALT 24  ALKPHOS 72  BILITOT 1.4*  PROT 7.2  ALBUMIN 3.6   Recent Labs  Lab 02/26/22 0015  LIPASE 23   No results for input(s): AMMONIA in the last 168 hours. Coagulation Profile: No results for input(s):  INR, PROTIME in the last 168 hours. Cardiac Enzymes: No results for input(s): CKTOTAL, CKMB, CKMBINDEX, TROPONINI in the last 168 hours. BNP (last 3 results) No results for input(s): PROBNP in the last 8760 hours. HbA1C: No results for input(s): HGBA1C in the last 72 hours. CBG: No results for input(s): GLUCAP in the last 168 hours. Lipid Profile: No results for input(s): CHOL, HDL, LDLCALC, TRIG, CHOLHDL, LDLDIRECT in the last 72 hours. Thyroid Function Tests: No results for input(s): TSH, T4TOTAL, FREET4, T3FREE, THYROIDAB in the last 72 hours. Anemia Panel: No results for input(s): VITAMINB12, FOLATE, FERRITIN, TIBC, IRON, RETICCTPCT in the last 72 hours. Sepsis Labs: No results for input(s): PROCALCITON, LATICACIDVEN in the last 168 hours.  Recent Results (from the past 240 hour(s))  Culture, blood (Routine X 2) w Reflex to ID Panel     Status: None (Preliminary result)   Collection Time: 02/26/22  5:20 AM   Specimen: BLOOD  Result Value Ref Range Status   Specimen Description BLOOD SITE NOT SPECIFIED  Final   Special Requests   Final    BOTTLES DRAWN AEROBIC AND ANAEROBIC Blood Culture adequate volume   Culture   Final    NO GROWTH < 12 HOURS Performed at [facility], [address] [place], [place] [address]    Report Status PENDING  Incomplete  Culture, blood (Routine X 2) w Reflex to ID Panel     Status: None (Preliminary result)   Collection Time: 02/26/22  5:25 AM   Specimen: BLOOD  Result Value Ref Range Status   Specimen Description BLOOD SITE NOT SPECIFIED  Final   Special Requests   Final    BOTTLES DRAWN AEROBIC AND ANAEROBIC Blood Culture adequate volume   Culture   Final    NO GROWTH < 12 HOURS Performed at [facility], [place] [address], [place], [place] [address]    Report Status PENDING  Incomplete  Surgical PCR screen     Status: None   Collection Time: 02/26/22 10:20 PM   Specimen: Nasal Mucosa; Nasal Swab  Result Value Ref Range Status    MRSA, PCR NEGATIVE NEGATIVE Final   Staphylococcus aureus NEGATIVE NEGATIVE Final    Comment: (NOTE) The Xpert SA Assay (FDA approved for NASAL specimens in patients 10 years of age and older), is one component of a comprehensive surveillance program. It is not intended to diagnose infection nor to guide or monitor treatment. Performed at [facility], [place] [address], [place],  [address]          Radiology Studies: DG Orthopantogram  Result Date: 02/26/2022 CLINICAL DATA:  Known spinal infection, evaluate for possible source EXAM: ORTHOPANTOGRAM/PANORAMIC COMPARISON:  None Available. FINDINGS: Panoramic view of the mandible was obtained and reveals significant dental hardware. Dental caries are noted at the base of the first and second molars on the left in the maxilla. No periapical lucency to suggest abscess is noted. No other focal abnormality is seen. IMPRESSION: Dental caries and prior dental hardware. No evidence of periapical abscess is seen. Electronically Signed   By: Inez Catalina M.D.   On: 02/26/2022 15:18   MR THORACIC SPINE W WO CONTRAST  Result Date: 02/26/2022 CLINICAL DATA:  61 year old male with mid back pain since last month. Anterior endplate erosions at F81-W29 on CTA suspicious for osteomyelitis. 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 EXAM: MRI THORACIC WITHOUT AND WITH CONTRAST TECHNIQUE: Multiplanar and multiecho pulse sequences of the thoracic spine were obtained without and with intravenous contrast. CONTRAST:  83m GADAVIST GADOBUTROL 1 MMOL/ML IV SOLN COMPARISON:  CTA abdomen and pelvis 0221 hours today. CT Abdomen and Pelvis 01/28/2022. Neck CT 08/18/2020. Thoracic spine radiographs 02/02/2022. FINDINGS: Limited cervical spine imaging: Chronic C5-C6 disc and endplate degeneration grossly stable since 2021. Thoracic spine segmentation: Evidence of hypoplastic or absent ribs at T12. Alignment: Stable thoracic kyphosis from the  radiographs last month. Vertebrae: Confluent abnormal marrow edema and enhancement in the T9 and T10 vertebral bodies, especially anteriorly where CTA this morning demonstrates bulky anterior endplate osteophytes have been partially eroded since the CT on 01/28/2022 (series 34 image 13 of this exam). There is associated prevertebral and bilateral paraspinal soft tissue thickening and enhancement (series 31, image 30), however, the intervening T9-T10 disc space seems to remain relatively normal. No posterior paraspinal soft tissue inflammation. No discrete fluid collection. And the epidural space here also seems to remain normal. There is also faint marrow edema and enhancement in the right superior T12 body on series 29, image 8, and perhaps early T11 anterior inferior endplate edema also. But no other paraspinal soft tissue abnormality. Anterior endplate osteophytes at T7 and T8 appear to remain normal. Background bone marrow signal is normal. Benign posterior element hemangioma  on the left at T5. Cord: Normal. Conus medullaris mostly visible at T12-L1. No abnormal intradural enhancement or dural thickening. Paraspinal and other soft tissues: Abnormal ventral and lateral paraspinal soft tissues at T9-T10 detailed above. Negative elsewhere. Visible chest remarkable trick 4 trace layering right pleural effusion and mild nonspecific dependent lung base opacity. Grossly negative visible mediastinum and upper abdominal viscera. Disc levels: No age advanced thoracic disc degeneration. Intermittent thoracic posterior element hypertrophy, including at the T9-T10 level greater on the left (series 30, image 28). But no significant thoracic spinal stenosis. No significant foraminal stenosis. IMPRESSION: 1. Transitional spinal anatomy. Hypoplastic ribs at T12. When counting from the skull base the abnormal anterior thoracic spinal segment is T9-T10. 2. Confluent abnormal edema and enhancement of the anterior T9-T10 bodies  where partial erosion of anterior endplate osteophytes has occurred by CT since 01/28/2022. Bulky associated ventral and lateral paraspinal soft tissue thickening and enhancement, however, the intervening T9-T10 disc remains relatively normal. Epidural space remains normal. And there is no abscess or fluid collection. Questionable early abnormal T12 vertebral body edema and enhancement. Therefore, differential considerations include: - unusual Osteomyelitis with little to no associated discitis. Recommend blood cultures. - lytic Tumor or Metastasis with extraosseous extension. 3. Mild for age thoracic spine degeneration (posterior element hypertrophy) with no spinal or foraminal stenosis. 4. Trace right pleural effusion. Electronically Signed   By: Genevie Ann M.D.   On: 02/26/2022 07:38   MR Lumbar Spine W Wo Contrast  Result Date: 02/26/2022 CLINICAL DATA:  61 year old male with mid back pain since last month. Anterior endplate erosions at B35-H29 on CTA suspicious for osteomyelitis. 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 EXAM: MRI LUMBAR SPINE WITHOUT AND WITH CONTRAST TECHNIQUE: Multiplanar and multiecho pulse sequences of the lumbar spine were obtained without and with intravenous contrast. CONTRAST:  54m GADAVIST GADOBUTROL 1 MMOL/ML IV SOLN COMPARISON:  Thoracic spine MRI today reported separately. CTA abdomen and pelvis 0221 hours today. FINDINGS: Segmentation: Transitional anatomy, as demonstrated on the thoracic MRI today. Fully sacralized L5 level. Correlation with radiographs is recommended prior to any operative intervention. Alignment: Mild levoconvex lumbar scoliosis. Relatively preserved lumbar lordosis. No significant spondylolisthesis. Vertebrae: Redemonstrated faint abnormal marrow edema and enhancement in the right superior T12 vertebral body as questioned on the thoracic MRI today (series 7, image 7 and series 2, image 7). But no other marrow edema or enhancement.  Background bone marrow signal is within normal limits. Intact visible sacrum and SI joints. Conus medullaris and cauda equina: Conus extends to the T12-L1 level. No lower spinal cord or conus signal abnormality. No abnormal intradural enhancement. No dural thickening. Capacious lumbar spinal canal. Cauda equina nerve roots appear normal. Paraspinal and other soft tissues: Negative. Disc levels: Similar to the thoracic MRI today, lumbar disc degeneration is generally mild for age and there is intermittent lumbar posterior element hypertrophy. No lumbar spinal stenosis. Small central disc protrusion at L4-L5 does not result in lateral recess stenosis. There is mild degenerative neural foraminal stenosis at the left L2, left L3, bilateral L4 nerve levels. L5-S1 is completely sacralized. IMPRESSION: 1. Transitional anatomy with fully sacralized L5 level when numbering from the skull base. Correlation with radiographs is recommended prior to any operative intervention. 2. Confirmed faint abnormal marrow edema and enhancement in the superior T12 vertebral body on the right. See associated abnormalities detailed on Thoracic MRI today. 3. But negative for age MRI appearance of the Lumbar spine. Mild degenerative lumbar neural foraminal stenosis.  Electronically Signed   By: Genevie Ann M.D.   On: 02/26/2022 07:44   CT L-SPINE NO CHARGE  Result Date: 02/26/2022 CLINICAL DATA:  Back pain. EXAM: CT LUMBAR SPINE WITHOUT CONTRAST TECHNIQUE: Multidetector CT imaging of the lumbar spine was performed without intravenous contrast administration. Multiplanar CT image reconstructions were also generated. RADIATION DOSE REDUCTION: This exam was performed according to the departmental dose-optimization program which includes automated exposure control, adjustment of the mA and/or kV according to patient size and/or use of iterative reconstruction technique. COMPARISON:  CT of abdomen and pelvis no contrast 01/28/2022, CT chest with  contrast 07/19/2018. FINDINGS: Segmentation: There are hypoplastic T12 ribs (confirmed on the prior chest CT) and only 4 non-rib-bearing lumbar segments. There is no transitional segment. Alignment: There is mild levoscoliosis. Apex is at L1-2. There is no AP listhesis. Vertebrae: There are moderate features of spondylosis. The vertebra are normal in heights. No fracture or focal lesion is seen. Paraspinal and other soft tissues: Negative apart from mild aortic atherosclerosis. Disc levels: There is mild disc space loss at L4-S1. Above L4 the discs are normal in heights. Above L3 no significant disc bulge is seen or herniations. There is mild facet spurring at L1-2, moderate asymmetric right facet hypertrophy L2-3, and bilateral moderate facet hypertrophy at L3-4 and L4-S1. There is slight nonstenosing posterior disc bulge at L3-4. At L4-S1 there is left paracentral shallow disc protrusion with overlying annular calcification and slight compression of the left S1 nerve root. Due to facet hypertrophy, at L3-4 there is moderate left foraminal stenosis and at L4-S1 there is moderate right and mild-to-moderate left foraminal stenosis. IMPRESSION: 1. Degenerative changes and mild levoscoliosis without evidence of fractures or focal bone lesion. 2. Shallow chronic left paracentral herniated disc L4-S1 with mild compression of the left S1 nerve root. 3. 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 4. CTA abdomen and pelvis today demonstrated peridiscal endplate erosions and fragmentation anteriorly at T9-10, above the level of the current study. Findings consistent with spondylodiscitis. Electronically Signed   By: Telford Nab M.D.   On: 02/26/2022 03:32   CT Angio Abd/Pel w/ and/or w/o  Addendum Date: 02/26/2022   ADDENDUM REPORT: 02/26/2022 03:34 ADDENDUM: The patient only has 4 lumbar segments with hypoplastic ribs at T12. This is confirmed on prior chest CT from 2019. The  anterior endplate erosions described in this report are therefore actually at T9-10, not at T10-11. Electronically Signed   By: Telford Nab M.D.   On: 02/26/2022 03:34   Result Date: 02/26/2022 CLINICAL DATA:  Renal artery stenosis. Recent right renal artery thrombus. Presents with right abdominal pain. EXAM: CTA ABDOMEN AND PELVIS WITHOUT AND WITH CONTRAST TECHNIQUE: Multidetector CT imaging of the abdomen and pelvis was performed using the standard protocol during bolus administration of intravenous contrast. Multiplanar reconstructed images and MIPs were obtained and reviewed to evaluate the vascular anatomy. RADIATION DOSE REDUCTION: This exam was performed according to the departmental dose-optimization program which includes automated exposure control, adjustment of the mA and/or kV according to patient size and/or use of iterative reconstruction technique. CONTRAST:  143m OMNIPAQUE IOHEXOL 350 MG/ML SOLN COMPARISON:  CTA abdomen and pelvis and reconstructions of 01/23/2022, CT abdomen and pelvis no contrast 01/28/2022. FINDINGS: VASCULAR Aorta: Small amount of scattered mixed plaque. No aneurysm, dissection or stenosis. Celiac: Patent without evidence of aneurysm, dissection, vasculitis or significant stenosis. There are Trace ostial calcifications which are nonstenosing. SMA: Patent without evidence of aneurysm,  dissection, vasculitis or significant stenosis. Renals: Both renal arteries are patent without evidence of aneurysm, dissection, vasculitis, fibromuscular dysplasia or significant stenosis. Left renal artery is single. On the right there is a hypoplastic artery to the lower pole arising just above the main artery. Right renal artery thrombus is no longer seen. IMA: Patent without evidence of aneurysm, dissection, vasculitis or significant stenosis. Inflow: Patent without evidence of aneurysm, dissection, vasculitis or significant stenosis. There is minimal calcification in the distal left  common iliac artery. No other iliac arterial calcific plaques are seen. Proximal Outflow: Bilateral common femoral and visualized portions of the superficial and profunda femoral arteries are patent without evidence of aneurysm, dissection, vasculitis or significant stenosis. Veins: Clear. Review of the MIP images confirms the above findings. NON-VASCULAR Lower chest: No acute abnormality. There is a trace right pleural effusion. Subsegmental atelectasis both bases. The cardiac size is normal. Hepatobiliary: There is a mildly steatotic and otherwise unremarkable liver. The gallbladder is absent without biliary dilatation. Pancreas: Partial fatty replacement in the head and proximal neck. No mass enhancement or ductal dilatation. Spleen: No mass enhancement. Mild splenomegaly with splenic AP diameter 14.6 cm Adrenals/Urinary Tract: Chronic perinephric stranding. No adrenal mass or renal mass enhancement. No calculus or hydronephrosis. Unremarkable bladder. Stomach/Bowel: No dilatation or wall thickening. An appendix is not seen. There is fluid throughout the colon but no inflammatory changes. Scattered uncomplicated colonic diverticula. Lymphatic: No adenopathy is seen. Reproductive: There is no prostatomegaly. Other: Small umbilical and inguinal fat hernias. There is no incarcerated hernia. There is no free air, hemorrhage or fluid. Musculoskeletal: There is new demonstration of peridiscal endplate erosions and bony fragmentation anteriorly at T10-11 and adjacent paraspinal soft tissue stranding most likely due to phlegmon. No paraspinal abscess is seen. Findings strongly suspect spondylodiscitis. There are degenerative changes in the thoracic and lumbar spine without further new abnormality. There is mild lumbar levoscoliosis apex at L2. There is mild hip DJD. IMPRESSION: VASCULAR 1. 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 2. Scattered aortic atherosclerosis but no stenosis, aneurysm or dissection in  the aorta and branch arteries. NON-VASCULAR 1. Fluid in the colon but no wall thickening or inflammatory changes. Findings may suggest a nonspecific diarrheal illness. 2. Interval new finding of peridiscal endplate erosions anteriorly with bony fragmentation at T10-11 with adjacent soft tissue reaction most likely due to phlegmon. 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 No epidural extension of the process is suspected at this time. 3. Trace right pleural effusion. 4. Mildly prominent spleen. 5. Small umbilical and inguinal fat hernias. 6. Additional findings described above. Electronically Signed: By: Telford Nab M.D. On: 02/26/2022 03:10    Scheduled Meds:  feeding supplement  237 mL Oral BID BM   gabapentin  600 mg Oral QHS   mupirocin ointment  1 application. Nasal BID   nicotine  14 mg Transdermal Daily   sodium chloride flush  3 mL Intravenous Q12H   tamsulosin  0.4 mg Oral Daily   Continuous Infusions:  lactated ringers 75 mL/hr at 02/27/22 0524     LOS: 1 day   Time spent: 18mn  Aspynn Clover C Sunya Humbarger, DO [facility]  If 7PM-7AM, please contact night-coverage [email or web address]  02/27/2022, 7:01 AM

## 2022-02-27 NOTE — Plan of Care (Signed)
  Problem: Education: Goal: Knowledge of General Education information will improve Description: Including pain rating scale, medication(s)/side effects and non-pharmacologic comfort measures Outcome: Progressing   Problem: Activity: Goal: Risk for activity intolerance will decrease Outcome: Progressing   Problem: Nutrition: Goal: Adequate nutrition will be maintained Outcome: Progressing   

## 2022-02-28 DIAGNOSIS — C099 Malignant neoplasm of tonsil, unspecified: Secondary | ICD-10-CM | POA: Diagnosis not present

## 2022-02-28 DIAGNOSIS — M545 Low back pain, unspecified: Secondary | ICD-10-CM | POA: Diagnosis not present

## 2022-02-28 DIAGNOSIS — R1011 Right upper quadrant pain: Secondary | ICD-10-CM | POA: Diagnosis not present

## 2022-02-28 DIAGNOSIS — K089 Disorder of teeth and supporting structures, unspecified: Secondary | ICD-10-CM | POA: Diagnosis not present

## 2022-02-28 MED ORDER — HYDROMORPHONE HCL 1 MG/ML IJ SOLN
1.0000 mg | INTRAMUSCULAR | Status: DC | PRN
  Administered 2022-02-28 – 2022-03-01 (×4): 1 mg via INTRAVENOUS
  Filled 2022-02-28 (×4): qty 1

## 2022-02-28 MED ORDER — PANTOPRAZOLE SODIUM 40 MG IV SOLR
40.0000 mg | Freq: Two times a day (BID) | INTRAVENOUS | Status: DC
  Administered 2022-02-28 – 2022-03-02 (×5): 40 mg via INTRAVENOUS
  Filled 2022-02-28 (×5): qty 10

## 2022-02-28 MED ORDER — ALUM & MAG HYDROXIDE-SIMETH 200-200-20 MG/5ML PO SUSP
30.0000 mL | Freq: Once | ORAL | Status: AC
  Administered 2022-02-28: 30 mL via ORAL
  Filled 2022-02-28: qty 30

## 2022-02-28 MED ORDER — LIDOCAINE VISCOUS HCL 2 % MT SOLN
15.0000 mL | Freq: Once | OROMUCOSAL | Status: AC
  Administered 2022-02-28: 15 mL via ORAL
  Filled 2022-02-28 (×2): qty 15

## 2022-02-28 MED ORDER — ADULT MULTIVITAMIN W/MINERALS CH
1.0000 | ORAL_TABLET | Freq: Every day | ORAL | Status: DC
  Administered 2022-02-28 – 2022-03-01 (×2): 1 via ORAL
  Filled 2022-02-28 (×2): qty 1

## 2022-02-28 NOTE — Progress Notes (Signed)
PROGRESS NOTE    Christopher [name].  QTM:[id number] DOB: 1961-04-03 DOA: 02/26/2022 PCP: Patient, No Pcp Per (Inactive)   Brief Narrative:  Pt is a 61 y.o. male with somewhat complex and convoluted history who presents with chief complaint of right flank/paraspinal muscle pain.  He has been previously evaluated at [facility] as well as [facility] for multiple medical complaints including abdominal pain, osteoarthritis dysphagia, odynophagia, intractable abdominal pain with questionable partial obstruction as well as history of diverticulosis/diverticulitis and cholecystitis.  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  Discussed with neurosurgery for possible biopsy of this area on Monday, 03/01/2022 and pain control in the interim.   Assessment & Plan:   Principal Problem:   Back pain Active Problems:   Tonsillar cancer (HCC)   Abdominal pain   Poor dentition   Intractable back/R flank pain Concurrent ambulatory dysfunction -Unclear etiology concern for infectious process versus malignancy or autoimmune, biopsy pending 03/01/2022 with IR per neurosurgery and ID recommendations -ID consulted for further delineation, discontinued antibiotics given patient does not meet sepsis criteria and imaging is unremarkable for any overt abscesses/large infectious areas -Multiple cultures pending including TB testing given recent weight loss although likely secondary to unspecified GI problems as listed below. -Pain well controlled on current regimen   Chronic abdominal pain/odynophagia -Patient indicates multiple evaluations in the outpatient and inpatient setting previously with GI, most recent endoscopy unremarkable. -Worsening right upper quadrant  abdominal pain postprandially over the past 24 hours -Trial GI cocktail and IV PPI -Patient continues to eat heavy greasy fatty salty food (meat loaf, mac & cheese, cheese grits, sausage) -we have discussed attempting to reduce diet to soft bland food   Poor dentition -Previous physician spoke with Dr. Conley Simmonds (not on call), appreciate insight and recommendations -Likely needs to continue with the plan for outpatient treatment unless acute worsening occurs -As above lengthy discussion on need for dietary changes, patient continues to consume high sugar carbonated beverages   H/o tonsillar cancer -No surgical resection -Completed chemo/rads -No evidence of recurrence at last check   Tobacco dependence -Long-term oral tobacco user -He is currently using nicotine replacement products -Continue patch as tolerated   DVT prophylaxis: Early ambulation, SCDs Code Status: Full Family Communication: Daughter and wife at bedside  Status is: Inpatient  Dispo: The patient is from: Home              Anticipated d/c is to: To be determined              Anticipated d/c date is: 48-72h              Patient currently not medically stable for discharge  Consultants:  Neurosurgery, IR, infectious disease  Procedures:  Biopsy pending 03/01/2022  Antimicrobials:  Holding as above  Subjective: Worsening abdominal pain postprandially yesterday worsening overnight, somewhat improved this morning but again had heavy breakfast and is complaining of worsening abdominal pain now.  Otherwise denies nausea vomiting diarrhea constipation headache fevers or chills.  Objective: Vitals:   02/27/22 0926 02/27/22 1544 02/27/22 1935 02/28/22 0420  BP: (!) 145/96 132/81 114/68 130/78  Pulse: 78 65 77 66  Resp:  _0 Temp: 98.1 F (36.7 C) 98.2 F (36.8 C) 98.8 F (37.1 C) 97.7 F (36.5 C)  TempSrc:  Oral  Oral Oral  SpO2: 98% 98% 97% 97%  Weight:      Height:        Intake/Output Summary  (Last 24 hours) at 02/28/2022 0710 Last data filed at 02/27/2022 1500 Gross per 24 hour  Intake 831.76 ml  Output --  Net 831.76 ml    Filed Weights   02/26/22 1800  Weight: 92.2 kg    Examination:  General:  Pleasantly resting in bed, No acute distress. HEENT:  Normocephalic atraumatic.  Sclerae nonicteric, noninjected.  Extraocular movements intact bilaterally. Neck:  Without mass or deformity.  Trachea is midline. Lungs:  Clear to auscultate bilaterally without rhonchi, wheeze, or rales. Heart:  Regular rate and rhythm.  Without murmurs, rubs, or gallops. Abdomen:  Soft, nontender, nondistended.  Without guarding or rebound. Extremities: Without cyanosis, clubbing, edema, or obvious deformity. Vascular:  Dorsalis pedis and posterior tibial pulses palpable bilaterally. Skin:  Warm and dry, no erythema, no ulcerations.  Data Reviewed: I have personally reviewed following labs and imaging studies  CBC: Recent Labs  Lab 02/26/22 0015 02/27/22 0121  WBC 8.2 6.7  HGB 13.5 12.1*  HCT 38.8* 35.3*  MCV 85.3 85.3  PLT 246 275    Basic Metabolic Panel: Recent Labs  Lab 02/26/22 0015 02/27/22 0121  NA 136 139  K 3.9 3.8  CL 105 106  CO2 22 25  GLUCOSE 117* 87  BUN 20 13  CREATININE 1.05 0.95  CALCIUM 9.5 9.1    GFR: Estimated Creatinine Clearance: 94.6 mL/min (by C-G formula based on SCr of 0.95 mg/dL). Liver Function Tests: Recent Labs  Lab 02/26/22 0015  AST 17  ALT 24  ALKPHOS 72  BILITOT 1.4*  PROT 7.2  ALBUMIN 3.6    Recent Labs  Lab 02/26/22 0015  LIPASE 23    No results for input(s): AMMONIA in the last 168 hours. Coagulation Profile: No results for input(s): INR, PROTIME in the last 168 hours. Cardiac Enzymes: No results for input(s): CKTOTAL, CKMB, CKMBINDEX, TROPONINI in the last 168 hours. BNP (last 3 results) No results for input(s): PROBNP in the last 8760 hours. HbA1C: No results for input(s): HGBA1C in the last 72 hours. CBG: No  results for input(s): GLUCAP in the last 168 hours. Lipid Profile: No results for input(s): CHOL, HDL, LDLCALC, TRIG, CHOLHDL, LDLDIRECT in the last 72 hours. Thyroid Function Tests: No results for input(s): TSH, T4TOTAL, FREET4, T3FREE, THYROIDAB in the last 72 hours. Anemia Panel: No results for input(s): VITAMINB12, FOLATE, FERRITIN, TIBC, IRON, RETICCTPCT in the last 72 hours. Sepsis Labs: No results for input(s): PROCALCITON, LATICACIDVEN in the last 168 hours.  Recent Results (from the past 240 hour(s))  Culture, blood (Routine X 2) w Reflex to ID Panel     Status: None (Preliminary result)   Collection Time: 02/26/22  5:20 AM   Specimen: BLOOD  Result Value Ref Range Status   Specimen Description BLOOD SITE NOT SPECIFIED  Final   Special Requests   Final    BOTTLES DRAWN AEROBIC AND ANAEROBIC Blood Culture adequate volume   Culture   Final    NO GROWTH 1 DAY Performed at [facility], [address] [place], [place] [address]    Report Status PENDING  Incomplete  Culture, blood (Routine X 2) w Reflex to ID Panel     Status: None (Preliminary result)   Collection Time: 02/26/22  5:25 AM   Specimen: BLOOD  Result Value Ref Range Status   Specimen Description BLOOD  SITE NOT SPECIFIED  Final   Special Requests   Final    BOTTLES DRAWN AEROBIC AND ANAEROBIC Blood Culture adequate volume   Culture   Final    NO GROWTH 1 DAY Performed at [facility], [address] [place], [place] [address]    Report Status PENDING  Incomplete  Surgical PCR screen     Status: None   Collection Time: 02/26/22 10:20 PM   Specimen: Nasal Mucosa; Nasal Swab  Result Value Ref Range Status   MRSA, PCR NEGATIVE NEGATIVE Final   Staphylococcus aureus NEGATIVE NEGATIVE Final    Comment: (NOTE) The Xpert SA Assay (FDA approved for NASAL specimens in patients 67 years of age and older), is one component of a comprehensive surveillance program. It is not intended to diagnose infection  nor to guide or monitor treatment. Performed at [facility], [place] [address], [place], [place] [address]           Radiology Studies: DG Orthopantogram  Result Date: 02/26/2022 CLINICAL DATA:  Known spinal infection, evaluate for possible source EXAM: ORTHOPANTOGRAM/PANORAMIC COMPARISON:  None Available. FINDINGS: Panoramic view of the mandible was obtained and reveals significant dental hardware. Dental caries are noted at the base of the first and second molars on the left in the maxilla. No periapical lucency to suggest abscess is noted. No other focal abnormality is seen. IMPRESSION: Dental caries and prior dental hardware. No evidence of periapical abscess is seen. Electronically Signed   By: Inez Catalina M.D.   On: 02/26/2022 15:18    Scheduled Meds:  feeding supplement  237 mL Oral BID BM   gabapentin  600 mg Oral QHS   mupirocin ointment  1 application. Nasal BID   nicotine  14 mg Transdermal Daily   sodium chloride flush  3 mL Intravenous Q12H   tamsulosin  0.4 mg Oral Daily   Continuous Infusions:  lactated ringers 75 mL/hr at 02/27/22 2315     LOS: 2 days   Time spent: 49mn  Susannah Carbin C Glenice Ciccone, DO [facility]  If 7PM-7AM, please contact night-coverage [email or web address]  02/28/2022, 7:10 AM

## 2022-02-28 NOTE — Consult Note (Signed)
Chief Complaint: Patient was seen in consultation today for image guided T12 vertebral body biopsy Chief Complaint  Patient presents with   Abdominal Pain     Referring Physician(s): Manandhar,S  Supervising Physician: Mir, Sharen Heck  Patient Status: [facility] - In-pt  History of Present Illness: Christopher [name]. is a 61 y.o. male, smokeless tobacco user, with past medical history of prostatitis, restless leg syndrome, tonsillar cancer with prior chemoradiation who was admitted to [facility] on 5/19 with worsening back pain primarily right-sided of approximately 1 month duration.  Patient stated pain started approximately 1 month ago after lifting heavy object with popping noted followed by headache, sore throat and fever, chills afterwards.  He has had intermittent fevers, night sweats, poor appetite and weight loss over the last month.  Patient also has poor dentition with significant decay of upper molars following prior radiation treatment.  History also significant for prior right renal artery thrombus.  CT angio of abdomen pelvis performed on 5/19 revealed:    VASCULAR   1. 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 2. Scattered aortic atherosclerosis but no stenosis, aneurysm or dissection in the aorta and branch arteries.   NON-VASCULAR   1. Fluid in the colon but no wall thickening or inflammatory changes. Findings may suggest a nonspecific diarrheal illness. 2. Interval new finding of peridiscal endplate erosions anteriorly with bony fragmentation at T10-11 with adjacent soft tissue reaction most likely due to phlegmon. 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 No epidural extension of the process is suspected at this time. 3. Trace right pleural effusion. 4. Mildly prominent spleen. 5. Small umbilical and inguinal fat hernias  MRI of the thoracic spine 5/19 revealed:   1. Transitional spinal anatomy. Hypoplastic ribs at T12.  When counting from the skull base the abnormal anterior thoracic spinal segment is T9-T10.   2. Confluent abnormal edema and enhancement of the anterior T9-T10 bodies where partial erosion of anterior endplate osteophytes has occurred by CT since 01/28/2022. Bulky associated ventral and lateral paraspinal soft tissue thickening and enhancement, however, the intervening T9-T10 disc remains relatively normal. Epidural space remains normal. And there is no abscess or fluid collection. Questionable early abnormal T12 vertebral body edema and enhancement. Therefore, differential considerations include: - unusual Osteomyelitis with little to no associated discitis. Recommend blood cultures. - lytic Tumor or Metastasis with extraosseous extension.   3. Mild for age thoracic spine degeneration (posterior element hypertrophy) with no spinal or foraminal stenosis.   4. Trace right pleural effusion  MRI of the lumbar spine revealed:   1. Transitional anatomy with fully sacralized L5 level when numbering from the skull base. Correlation with radiographs is recommended prior to any operative intervention.   2. Confirmed faint abnormal marrow edema and enhancement in the superior T12 vertebral body on the right. See associated abnormalities detailed on Thoracic MRI today.   3. But negative for age MRI appearance of the Lumbar spine. Mild degenerative lumbar neural foraminal stenosis.   He is currently afebrile, latest WBC normal, hemoglobin 12.1, platelets normal, creatinine normal, PT/INR pend; he is on airborne precautions, QuantiFERON -TB pending; blood cultures negative to date; request now received for image guided T12 biopsy for further evaluation(PATH/CX)  Past Surgical History:  Procedure Laterality Date   CHOLECYSTECTOMY      Allergies: Losartan  Medications: Prior to Admission medications   Medication Sig Start Date End Date Taking? Authorizing Provider  diclofenac  (VOLTAREN) 75 MG EC tablet Take 75 mg by  mouth 2 (two) times daily. 02/17/22  Yes [provider]  gabapentin (NEURONTIN) 600 MG tablet Take 600 mg by mouth at bedtime.   Yes [provider]  tamsulosin (FLOMAX) 0.4 MG CAPS capsule Take 0.4 mg by mouth daily.    Yes [provider]  traMADol (ULTRAM) 50 MG tablet Take 50-100 mg by mouth every 6 (six) hours as needed for moderate pain. 01/20/22  Yes [provider]     Family History  Problem Relation Age of Onset   Alcoholism Mother    Alcoholism Father     Social History   Socioeconomic History   Marital status: Married    Spouse name: Not on file   Number of children: Not on file   Years of education: Not on file   Highest education level: Not on file  Occupational History   Occupation: Surveyor, quantity  Tobacco Use   Smoking status: Never   Smokeless tobacco: Current    Types: Snuff   Tobacco comments:    Uses nicotine replacement pouch  Substance and Sexual Activity   Alcohol use: Yes    Alcohol/week: 2.0 standard drinks    Types: 2 Cans of beer per week    Comment: rare   Drug use: Not Currently   Sexual activity: Not on file  Other Topics Concern   Not on file  Social History Narrative   Not on file   Social Determinants of Health   Financial Resource Strain: Not on file  Food Insecurity: Not on file  Transportation Needs: Not on file  Physical Activity: Not on file  Stress: Not on file  Social Connections: Not on file      Review of Systems currently denies fever, headache, chest pain, worsening dyspnea, cough, nausea, vomiting or bleeding.  Vital Signs: BP 126/78 (BP Location: Left Arm)   Pulse 65   Temp 98.1 F (36.7 C) (Oral)   Resp 17   Ht '6\' 1"'$  (1.854 m)   Wt 203 lb 4.2 oz (92.2 kg)   SpO2 97%   BMI 26.82 kg/m   Physical Exam, awake, alert.  Chest with slightly diminished breath sounds right base, left clear.  Heart with regular rate and rhythm.  Abdomen soft,  positive bowel sounds, some mild right lower quadrant tenderness to palpation. No lower extremity edema  Imaging: DG Orthopantogram  Result Date: 02/26/2022 CLINICAL DATA:  Known spinal infection, evaluate for possible source EXAM: ORTHOPANTOGRAM/PANORAMIC COMPARISON:  None Available. FINDINGS: Panoramic view of the mandible was obtained and reveals significant dental hardware. Dental caries are noted at the base of the first and second molars on the left in the maxilla. No periapical lucency to suggest abscess is noted. No other focal abnormality is seen. IMPRESSION: Dental caries and prior dental hardware. No evidence of periapical abscess is seen. Electronically Signed   By: Inez Catalina M.D.   On: 02/26/2022 15:18   MR THORACIC SPINE W WO CONTRAST  Result Date: 02/26/2022 CLINICAL DATA:  61 year old male with mid back pain since last month. Anterior endplate erosions at S06-T01 on CTA suspicious for osteomyelitis. 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 EXAM: MRI THORACIC WITHOUT AND WITH CONTRAST TECHNIQUE: Multiplanar and multiecho pulse sequences of the thoracic spine were obtained without and with intravenous contrast. CONTRAST:  73m GADAVIST GADOBUTROL 1 MMOL/ML IV SOLN COMPARISON:  CTA abdomen and pelvis 0221 hours today. CT Abdomen and Pelvis 01/28/2022. Neck CT 08/18/2020. Thoracic spine radiographs 02/02/2022. FINDINGS: Limited cervical  spine imaging: Chronic C5-C6 disc and endplate degeneration grossly stable since 2021. Thoracic spine segmentation: Evidence of hypoplastic or absent ribs at T12. Alignment: Stable thoracic kyphosis from the radiographs last month. Vertebrae: Confluent abnormal marrow edema and enhancement in the T9 and T10 vertebral bodies, especially anteriorly where CTA this morning demonstrates bulky anterior endplate osteophytes have been partially eroded since the CT on 01/28/2022 (series 34 image 13 of this exam). There is associated prevertebral and  bilateral paraspinal soft tissue thickening and enhancement (series 31, image 30), however, the intervening T9-T10 disc space seems to remain relatively normal. No posterior paraspinal soft tissue inflammation. No discrete fluid collection. And the epidural space here also seems to remain normal. There is also faint marrow edema and enhancement in the right superior T12 body on series 29, image 8, and perhaps early T11 anterior inferior endplate edema also. But no other paraspinal soft tissue abnormality. Anterior endplate osteophytes at T7 and T8 appear to remain normal. Background bone marrow signal is normal. Benign posterior element hemangioma on the left at T5. Cord: Normal. Conus medullaris mostly visible at T12-L1. No abnormal intradural enhancement or dural thickening. Paraspinal and other soft tissues: Abnormal ventral and lateral paraspinal soft tissues at T9-T10 detailed above. Negative elsewhere. Visible chest remarkable trick 4 trace layering right pleural effusion and mild nonspecific dependent lung base opacity. Grossly negative visible mediastinum and upper abdominal viscera. Disc levels: No age advanced thoracic disc degeneration. Intermittent thoracic posterior element hypertrophy, including at the T9-T10 level greater on the left (series 30, image 28). But no significant thoracic spinal stenosis. No significant foraminal stenosis. IMPRESSION: 1. Transitional spinal anatomy. Hypoplastic ribs at T12. When counting from the skull base the abnormal anterior thoracic spinal segment is T9-T10. 2. Confluent abnormal edema and enhancement of the anterior T9-T10 bodies where partial erosion of anterior endplate osteophytes has occurred by CT since 01/28/2022. Bulky associated ventral and lateral paraspinal soft tissue thickening and enhancement, however, the intervening T9-T10 disc remains relatively normal. Epidural space remains normal. And there is no abscess or fluid collection. Questionable early  abnormal T12 vertebral body edema and enhancement. Therefore, differential considerations include: - unusual Osteomyelitis with little to no associated discitis. Recommend blood cultures. - lytic Tumor or Metastasis with extraosseous extension. 3. Mild for age thoracic spine degeneration (posterior element hypertrophy) with no spinal or foraminal stenosis. 4. Trace right pleural effusion. Electronically Signed   By: Genevie Ann M.D.   On: 02/26/2022 07:38   MR Lumbar Spine W Wo Contrast  Result Date: 02/26/2022 CLINICAL DATA:  61 year old male with mid back pain since last month. Anterior endplate erosions at P32-R51 on CTA suspicious for osteomyelitis. 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 EXAM: MRI LUMBAR SPINE WITHOUT AND WITH CONTRAST TECHNIQUE: Multiplanar and multiecho pulse sequences of the lumbar spine were obtained without and with intravenous contrast. CONTRAST:  45m GADAVIST GADOBUTROL 1 MMOL/ML IV SOLN COMPARISON:  Thoracic spine MRI today reported separately. CTA abdomen and pelvis 0221 hours today. FINDINGS: Segmentation: Transitional anatomy, as demonstrated on the thoracic MRI today. Fully sacralized L5 level. Correlation with radiographs is recommended prior to any operative intervention. Alignment: Mild levoconvex lumbar scoliosis. Relatively preserved lumbar lordosis. No significant spondylolisthesis. Vertebrae: Redemonstrated faint abnormal marrow edema and enhancement in the right superior T12 vertebral body as questioned on the thoracic MRI today (series 7, image 7 and series 2, image 7). But no other marrow edema or enhancement. Background bone marrow signal is within normal limits. Intact  visible sacrum and SI joints. Conus medullaris and cauda equina: Conus extends to the T12-L1 level. No lower spinal cord or conus signal abnormality. No abnormal intradural enhancement. No dural thickening. Capacious lumbar spinal canal. Cauda equina nerve roots appear normal.  Paraspinal and other soft tissues: Negative. Disc levels: Similar to the thoracic MRI today, lumbar disc degeneration is generally mild for age and there is intermittent lumbar posterior element hypertrophy. No lumbar spinal stenosis. Small central disc protrusion at L4-L5 does not result in lateral recess stenosis. There is mild degenerative neural foraminal stenosis at the left L2, left L3, bilateral L4 nerve levels. L5-S1 is completely sacralized. IMPRESSION: 1. Transitional anatomy with fully sacralized L5 level when numbering from the skull base. Correlation with radiographs is recommended prior to any operative intervention. 2. Confirmed faint abnormal marrow edema and enhancement in the superior T12 vertebral body on the right. See associated abnormalities detailed on Thoracic MRI today. 3. But negative for age MRI appearance of the Lumbar spine. Mild degenerative lumbar neural foraminal stenosis. Electronically Signed   By: Genevie Ann M.D.   On: 02/26/2022 07:44   CT L-SPINE NO CHARGE  Result Date: 02/26/2022 CLINICAL DATA:  Back pain. EXAM: CT LUMBAR SPINE WITHOUT CONTRAST TECHNIQUE: Multidetector CT imaging of the lumbar spine was performed without intravenous contrast administration. Multiplanar CT image reconstructions were also generated. RADIATION DOSE REDUCTION: This exam was performed according to the departmental dose-optimization program which includes automated exposure control, adjustment of the mA and/or kV according to patient size and/or use of iterative reconstruction technique. COMPARISON:  CT of abdomen and pelvis no contrast 01/28/2022, CT chest with contrast 07/19/2018. FINDINGS: Segmentation: There are hypoplastic T12 ribs (confirmed on the prior chest CT) and only 4 non-rib-bearing lumbar segments. There is no transitional segment. Alignment: There is mild levoscoliosis. Apex is at L1-2. There is no AP listhesis. Vertebrae: There are moderate features of spondylosis. The vertebra are  normal in heights. No fracture or focal lesion is seen. Paraspinal and other soft tissues: Negative apart from mild aortic atherosclerosis. Disc levels: There is mild disc space loss at L4-S1. Above L4 the discs are normal in heights. Above L3 no significant disc bulge is seen or herniations. There is mild facet spurring at L1-2, moderate asymmetric right facet hypertrophy L2-3, and bilateral moderate facet hypertrophy at L3-4 and L4-S1. There is slight nonstenosing posterior disc bulge at L3-4. At L4-S1 there is left paracentral shallow disc protrusion with overlying annular calcification and slight compression of the left S1 nerve root. Due to facet hypertrophy, at L3-4 there is moderate left foraminal stenosis and at L4-S1 there is moderate right and mild-to-moderate left foraminal stenosis. IMPRESSION: 1. Degenerative changes and mild levoscoliosis without evidence of fractures or focal bone lesion. 2. Shallow chronic left paracentral herniated disc L4-S1 with mild compression of the left S1 nerve root. 3. 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 4. CTA abdomen and pelvis today demonstrated peridiscal endplate erosions and fragmentation anteriorly at T9-10, above the level of the current study. Findings consistent with spondylodiscitis. Electronically Signed   By: Telford Nab M.D.   On: 02/26/2022 03:32   CT Angio Abd/Pel w/ and/or w/o  Addendum Date: 02/26/2022   ADDENDUM REPORT: 02/26/2022 03:34 ADDENDUM: The patient only has 4 lumbar segments with hypoplastic ribs at T12. This is confirmed on prior chest CT from 2019. The anterior endplate erosions described in this report are therefore actually at T9-10, not at T10-11. Electronically  Signed   By: Telford Nab M.D.   On: 02/26/2022 03:34   Result Date: 02/26/2022 CLINICAL DATA:  Renal artery stenosis. Recent right renal artery thrombus. Presents with right abdominal pain. EXAM: CTA ABDOMEN AND PELVIS  WITHOUT AND WITH CONTRAST TECHNIQUE: Multidetector CT imaging of the abdomen and pelvis was performed using the standard protocol during bolus administration of intravenous contrast. Multiplanar reconstructed images and MIPs were obtained and reviewed to evaluate the vascular anatomy. RADIATION DOSE REDUCTION: This exam was performed according to the departmental dose-optimization program which includes automated exposure control, adjustment of the mA and/or kV according to patient size and/or use of iterative reconstruction technique. CONTRAST:  174m OMNIPAQUE IOHEXOL 350 MG/ML SOLN COMPARISON:  CTA abdomen and pelvis and reconstructions of 01/23/2022, CT abdomen and pelvis no contrast 01/28/2022. FINDINGS: VASCULAR Aorta: Small amount of scattered mixed plaque. No aneurysm, dissection or stenosis. Celiac: Patent without evidence of aneurysm, dissection, vasculitis or significant stenosis. There are Trace ostial calcifications which are nonstenosing. SMA: Patent without evidence of aneurysm, dissection, vasculitis or significant stenosis. Renals: Both renal arteries are patent without evidence of aneurysm, dissection, vasculitis, fibromuscular dysplasia or significant stenosis. Left renal artery is single. On the right there is a hypoplastic artery to the lower pole arising just above the main artery. Right renal artery thrombus is no longer seen. IMA: Patent without evidence of aneurysm, dissection, vasculitis or significant stenosis. Inflow: Patent without evidence of aneurysm, dissection, vasculitis or significant stenosis. There is minimal calcification in the distal left common iliac artery. No other iliac arterial calcific plaques are seen. Proximal Outflow: Bilateral common femoral and visualized portions of the superficial and profunda femoral arteries are patent without evidence of aneurysm, dissection, vasculitis or significant stenosis. Veins: Clear. Review of the MIP images confirms the above findings.  NON-VASCULAR Lower chest: No acute abnormality. There is a trace right pleural effusion. Subsegmental atelectasis both bases. The cardiac size is normal. Hepatobiliary: There is a mildly steatotic and otherwise unremarkable liver. The gallbladder is absent without biliary dilatation. Pancreas: Partial fatty replacement in the head and proximal neck. No mass enhancement or ductal dilatation. Spleen: No mass enhancement. Mild splenomegaly with splenic AP diameter 14.6 cm Adrenals/Urinary Tract: Chronic perinephric stranding. No adrenal mass or renal mass enhancement. No calculus or hydronephrosis. Unremarkable bladder. Stomach/Bowel: No dilatation or wall thickening. An appendix is not seen. There is fluid throughout the colon but no inflammatory changes. Scattered uncomplicated colonic diverticula. Lymphatic: No adenopathy is seen. Reproductive: There is no prostatomegaly. Other: Small umbilical and inguinal fat hernias. There is no incarcerated hernia. There is no free air, hemorrhage or fluid. Musculoskeletal: There is new demonstration of peridiscal endplate erosions and bony fragmentation anteriorly at T10-11 and adjacent paraspinal soft tissue stranding most likely due to phlegmon. No paraspinal abscess is seen. Findings strongly suspect spondylodiscitis. There are degenerative changes in the thoracic and lumbar spine without further new abnormality. There is mild lumbar levoscoliosis apex at L2. There is mild hip DJD. IMPRESSION: VASCULAR 1. 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 2. Scattered aortic atherosclerosis but no stenosis, aneurysm or dissection in the aorta and branch arteries. NON-VASCULAR 1. Fluid in the colon but no wall thickening or inflammatory changes. Findings may suggest a nonspecific diarrheal illness. 2. Interval new finding of peridiscal endplate erosions anteriorly with bony fragmentation at T10-11 with adjacent soft tissue reaction most likely due to phlegmon. Findings are  of strong concern for spondylodiscitis. No epidural extension of the process is suspected at this time.  3. Trace right pleural effusion. 4. Mildly prominent spleen. 5. Small umbilical and inguinal fat hernias. 6. Additional findings described above. Electronically Signed: By: Telford Nab M.D. On: 02/26/2022 03:10    Labs:  CBC: Recent Labs    02/26/22 0015 02/27/22 0121  WBC 8.2 6.7  HGB 13.5 12.1*  HCT 38.8* 35.3*  PLT 246 220    COAGS: No results for input(s): INR, APTT in the last 8760 hours.  BMP: Recent Labs    02/26/22 0015 02/27/22 0121  NA 136 139  K 3.9 3.8  CL 105 106  CO2 22 25  GLUCOSE 117* 87  BUN 20 13  CALCIUM 9.5 9.1  CREATININE 1.05 0.95  GFRNONAA >60 >60    LIVER FUNCTION TESTS: Recent Labs    02/26/22 0015  BILITOT 1.4*  AST 17  ALT 24  ALKPHOS 72  PROT 7.2  ALBUMIN 3.6    TUMOR MARKERS: No results for input(s): AFPTM, CEA, CA199, CHROMGRNA in the last 8760 hours.  Assessment and Plan:  61 y.o. male, smokeless tobacco user, with past medical history of prostatitis, restless leg syndrome, tonsillar cancer with prior chemoradiation who was admitted to [facility] on 5/19 with worsening back pain primarily right-sided of approximately 1 month duration.  Patient stated pain started approximately 1 month ago after lifting heavy object with popping noted followed by headache, sore throat and fever, chills afterwards.  He has had intermittent fevers, night sweats, poor appetite and weight loss over the last month.  Patient also has poor dentition with significant decay of upper molars following prior radiation treatment.  History also significant for prior right renal artery thrombus.  CT angio of abdomen pelvis performed on 5/19 revealed:    VASCULAR   1. 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 2. Scattered aortic atherosclerosis but no stenosis, aneurysm or dissection in the aorta and branch arteries.   NON-VASCULAR    1. Fluid in the colon but no wall thickening or inflammatory changes. Findings may suggest a nonspecific diarrheal illness. 2. Interval new finding of peridiscal endplate erosions anteriorly with bony fragmentation at T10-11 with adjacent soft tissue reaction most likely due to phlegmon. 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 No epidural extension of the process is suspected at this time. 3. Trace right pleural effusion. 4. Mildly prominent spleen. 5. Small umbilical and inguinal fat hernias  MRI of the thoracic spine 5/19 revealed:   1. Transitional spinal anatomy. Hypoplastic ribs at T12. When counting from the skull base the abnormal anterior thoracic spinal segment is T9-T10.   2. Confluent abnormal edema and enhancement of the anterior T9-T10 bodies where partial erosion of anterior endplate osteophytes has occurred by CT since 01/28/2022. Bulky associated ventral and lateral paraspinal soft tissue thickening and enhancement, however, the intervening T9-T10 disc remains relatively normal. Epidural space remains normal. And there is no abscess or fluid collection. Questionable early abnormal T12 vertebral body edema and enhancement. Therefore, differential considerations include: - unusual Osteomyelitis with little to no associated discitis. Recommend blood cultures. - lytic Tumor or Metastasis with extraosseous extension.   3. Mild for age thoracic spine degeneration (posterior element hypertrophy) with no spinal or foraminal stenosis.   4. Trace right pleural effusion  MRI of the lumbar spine revealed:   1. Transitional anatomy with fully sacralized L5 level when numbering from the skull base. Correlation with radiographs is recommended prior to any operative intervention.   2. Confirmed faint abnormal marrow edema and enhancement in  the superior T12 vertebral body on the right. See associated abnormalities detailed on Thoracic MRI today.   3. But  negative for age MRI appearance of the Lumbar spine. Mild degenerative lumbar neural foraminal stenosis.   He is currently afebrile, latest WBC normal, hemoglobin 12.1, platelets normal, creatinine normal, PT/INR pend; he is on airborne precautions, QuantiFERON -TB pending; blood cultures negative to date; request now received for image guided T12 biopsy for further evaluation(PATH/CX).  Imaging studies have been reviewed by Dr. Dwaine Gale. Risks and benefits of procedure was discussed with the patient /spouse including, but not limited to bleeding, infection, damage to adjacent structures or low yield requiring additional tests.  All of the questions were answered and there is agreement to proceed.  Consent signed and in chart.  Procedure tentatively planned for 5/22.    Thank you for this interesting consult.  I greatly enjoyed meeting [employer] and look forward to participating in their care.  A copy of this report was sent to the requesting provider on this date.  Electronically Signed: D. Rowe Robert, PA-C 02/28/2022, 10:25 AM   I spent a total of  25 minutes   in face to face in clinical consultation, greater than 50% of which was counseling/coordinating care for image guided T12 biopsy

## 2022-02-28 NOTE — Progress Notes (Signed)
Initial Nutrition Assessment  DOCUMENTATION CODES:   Not applicable  INTERVENTION:   -Continue Ensure Enlive po BID, each supplement provides 350 kcal and 20 grams of protein -MVI with minerals daily  NUTRITION DIAGNOSIS:   Increased nutrient needs related to acute illness as evidenced by estimated needs.  GOAL:   Patient will meet greater than or equal to 90% of their needs  MONITOR:   PO intake, Supplement acceptance  REASON FOR ASSESSMENT:   Malnutrition Screening Tool    ASSESSMENT:   Pt with medical history significant of prostatitis and restless leg syndrome presenting with flank pain  Pt admitted with back and rt flank pain, ongoing over the past month.   Per radiology notes, plan for T12 vertebral body biopsy scheduled for 03/01/22.   Reviewed I/O's: +832 ml x 24 hours and +2.1 L since admission  Pt unavailable at time of visit. Attempted to speak with pt via call to hospital room phone, however, unable to reach. RD unable to obtain further nutrition-related history or complete nutrition-focused physical exam at this time.    Pt currently on a regular diet. Noted good oral intake; meal completions 100%. Pt also consuming ordered Ensure supplements per MAR.   Noted pt with significant tooth decay in upper left molars secondary to radiation treatments. He has been referred to an oral surgeon in [place] as an outpatient, but has not heard back yet.   Reviewed wt hx; noted distant history of weight loss. Per CareEverywhere, noted wt of 221# on 02/02/22. Pt has experienced an 8% wt loss over the past month, which is significant for time frame.   Medications reviewed.   Labs reviewed.   Diet Order:   Diet Order             Diet NPO time specified Except for: Sips with Meds  Diet effective midnight           Diet regular Room service appropriate? Yes; Fluid consistency: Thin  Diet effective now                   EDUCATION NEEDS:   No education  needs have been identified at this time  Skin:  Skin Assessment: Reviewed RN Assessment  Last BM:  02/26/22  Height:   Ht Readings from Last 1 Encounters:  02/26/22 '6\' 1"'$  (1.854 m)    Weight:   Wt Readings from Last 1 Encounters:  02/26/22 92.2 kg    Ideal Body Weight:  83.6 kg  BMI:  Body mass index is 26.82 kg/m.  Estimated Nutritional Needs:   Kcal:  2300-2500  Protein:  110-125 grams  Fluid:  > 2 L    Loistine Chance, RD, LDN, [place] Registered Dietitian II Certified Diabetes Care and Education Specialist Please refer to [facility] for RD and/or RD on-call/weekend/after hours pager

## 2022-03-01 ENCOUNTER — Inpatient Hospital Stay (HOSPITAL_COMMUNITY): Payer: 59

## 2022-03-01 DIAGNOSIS — K089 Disorder of teeth and supporting structures, unspecified: Secondary | ICD-10-CM | POA: Diagnosis not present

## 2022-03-01 DIAGNOSIS — R1011 Right upper quadrant pain: Secondary | ICD-10-CM | POA: Diagnosis not present

## 2022-03-01 DIAGNOSIS — C099 Malignant neoplasm of tonsil, unspecified: Secondary | ICD-10-CM | POA: Diagnosis not present

## 2022-03-01 DIAGNOSIS — M545 Low back pain, unspecified: Secondary | ICD-10-CM | POA: Diagnosis not present

## 2022-03-01 HISTORY — PX: IR FLUORO GUIDED NEEDLE PLC ASPIRATION/INJECTION LOC: IMG2395

## 2022-03-01 LAB — PROTIME-INR
INR: 1.1 (ref 0.8–1.2)
Prothrombin Time: 14.4 seconds (ref 11.4–15.2)

## 2022-03-01 IMAGING — XA IR FLUORO GUIDE NDL PLMT / BX
8 of 9 series · 14 of 24 positions shown · non-contrast
Comparison: none

INDICATION: Severe lower thoracic back pain. Abnormal marrow signal of T9 and
T10 vertebral bodies. Previous history of tonsillar carcinoma.

[Series 1: single · 1 of 2 slices shown (1 of 7)]
[im 1/2]
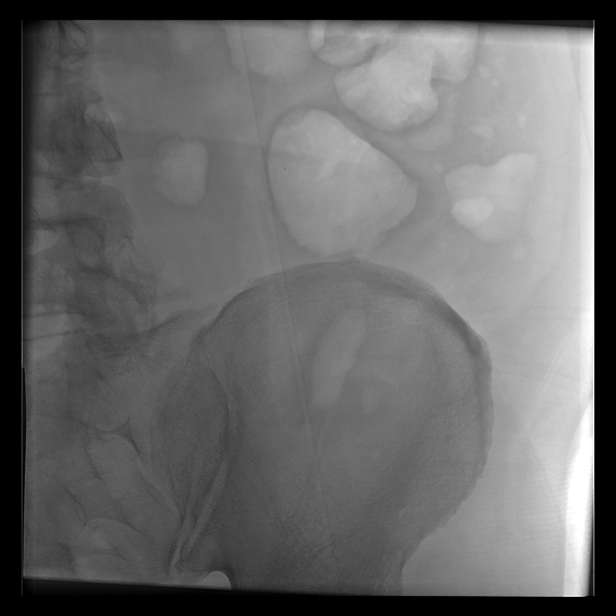

[Series 3: single · 1 of 2 slices shown (2 of 7)]
[im 1/2]
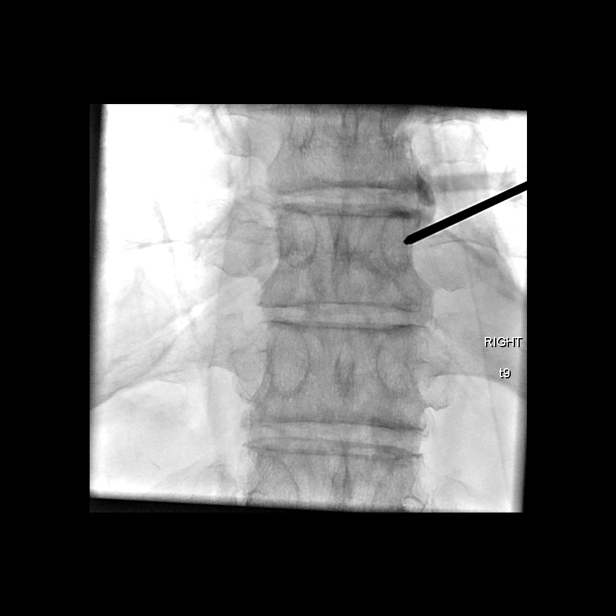

[Series 4: single · 1 of 2 slices shown (3 of 7)]
[im 2/2]
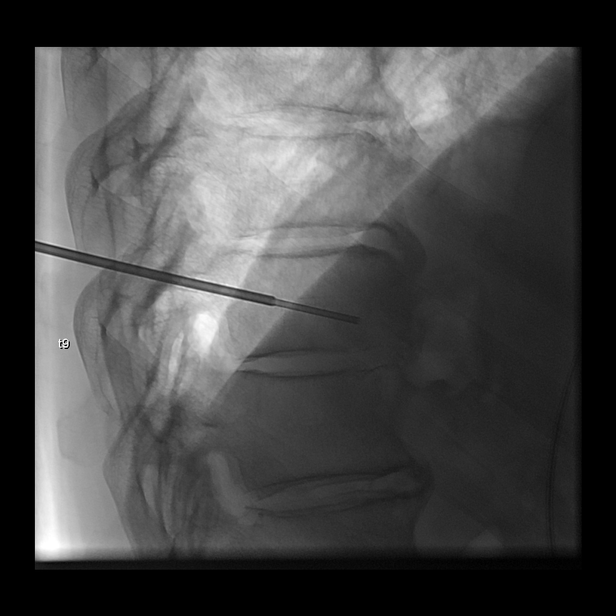

[Series 5: single · 1 of 2 slices shown (4 of 7)]
[im 2/2]
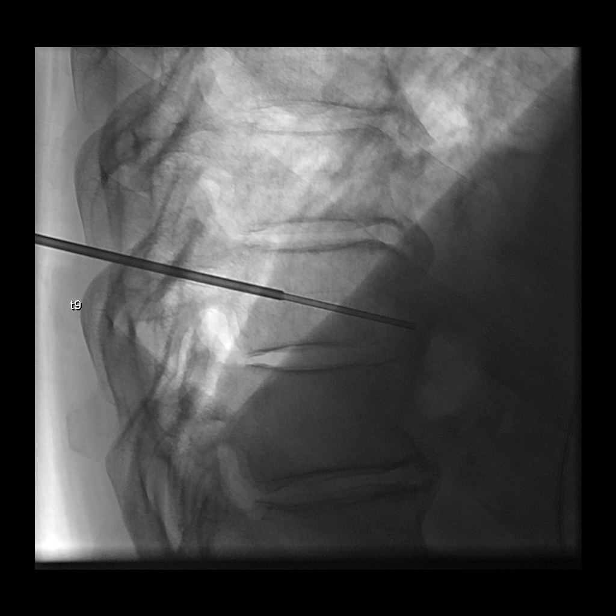

[Series 6: single · 1 of 2 slices shown (5 of 7)]
[im 1/2]
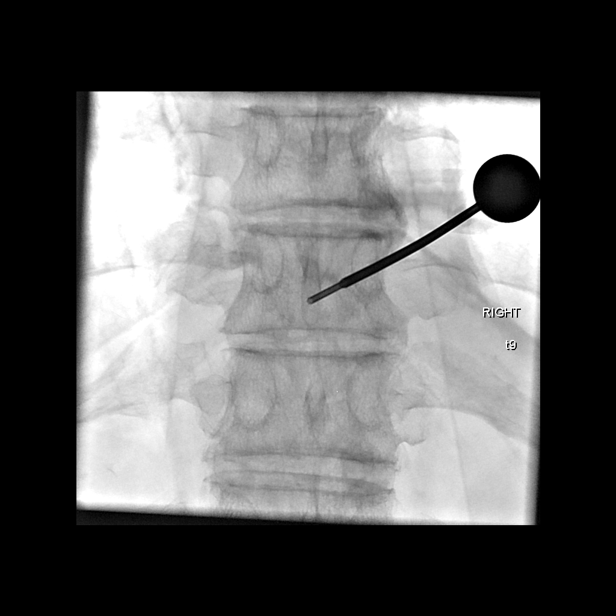

[Series 7: single · 1 of 2 slices shown (6 of 7)]
[im 1/2]
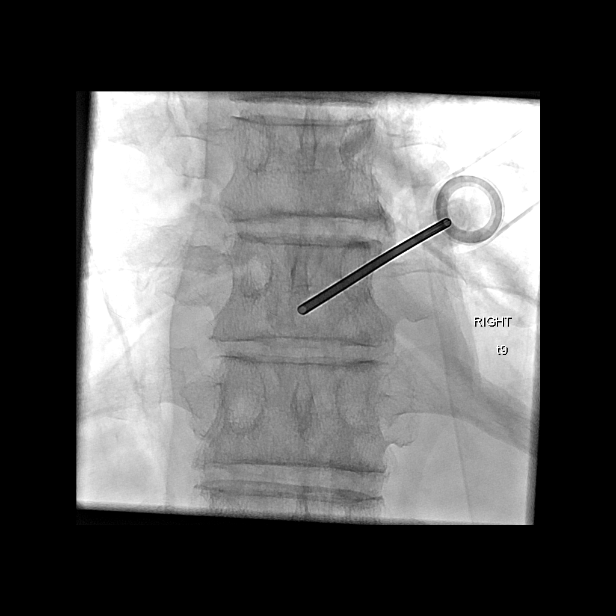

[Series 8: single · 2 of 2 slices shown (7 of 7)]
[im 1/2]
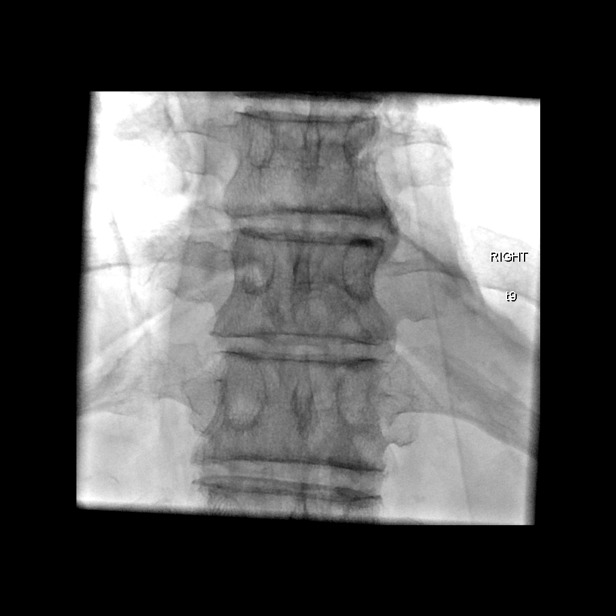
[im 2/2]
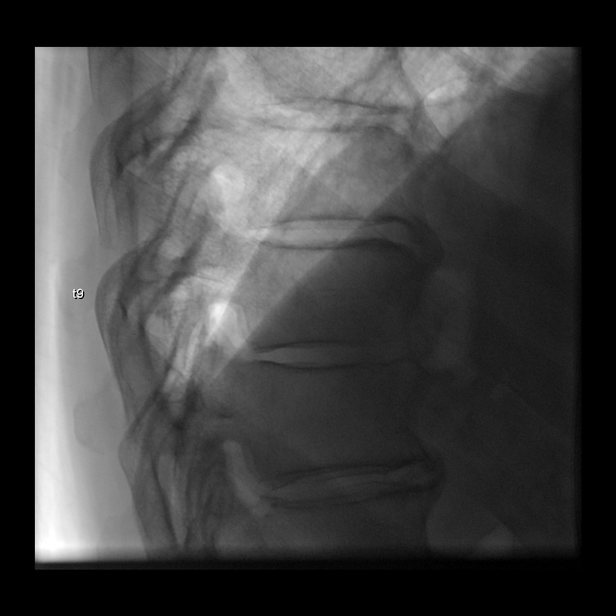

[Series 300: ir fluoro guided needle plc aspiration/i · 6 of 14 slices shown]
[im 2/14]
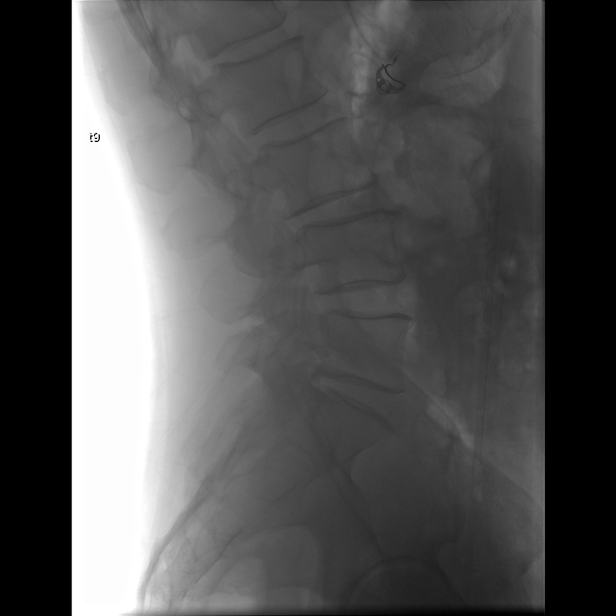
[im 4/14]
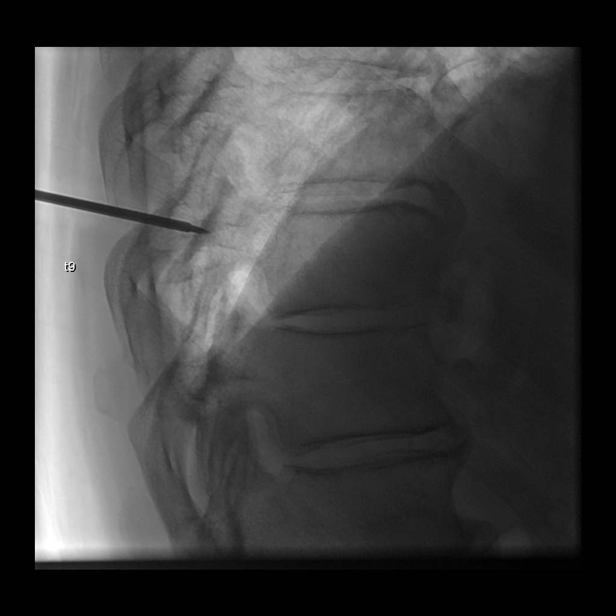
[im 7/14]
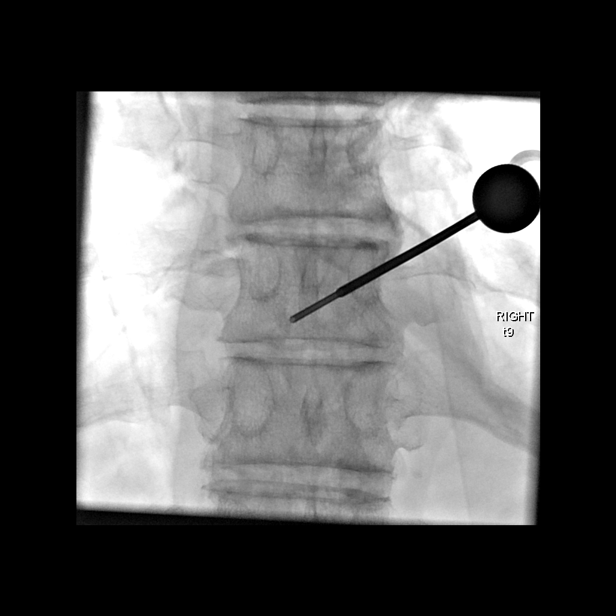
[im 8/14]
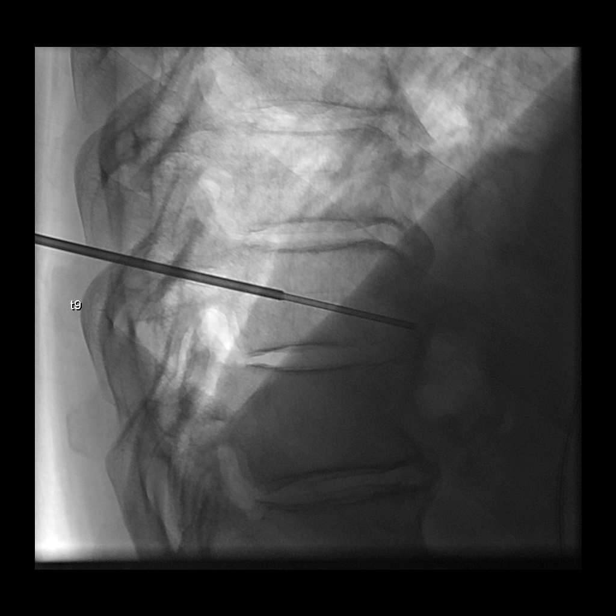
[im 11/14]
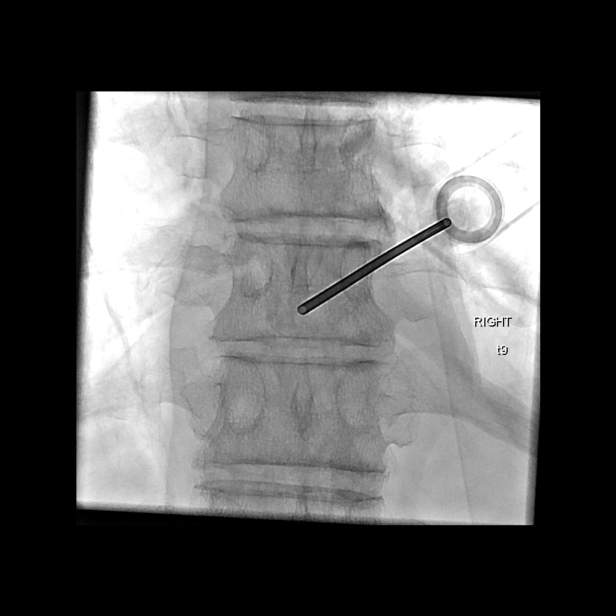
[im 14/14]
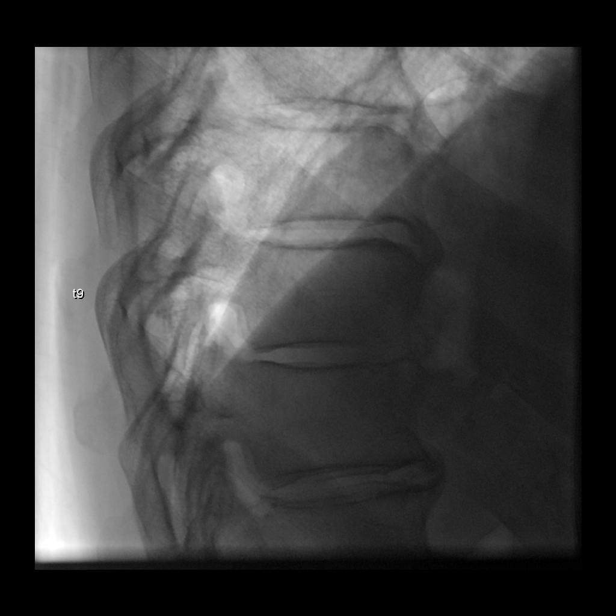

[14 of 24 positions shown; findings below may reference images not displayed]

EXAM:
FLUOROSCOPIC GUIDED CORE BIOPSY AT T9 VERTEBRAL BODY

MEDICATIONS:
Versed 2.5 mg IV.  Fentanyl 75 mcg IV

Radiation exposure: Fluoro time 7.8 minutes.  522 mGy.

ANESTHESIA/SEDATION:
Moderate (conscious) sedation was employed during this procedure. A
total of Versed 2.5 mg and Fentanyl 75 mcg was administered
intravenously by the radiology nurse.

Total intra-service moderate Sedation Time: 25 minutes. The
patient's level of consciousness and vital signs were monitored
continuously by radiology nursing throughout the procedure under my
direct supervision.

COMPLICATIONS:
None immediate.

PROCEDURE:
Informed written consent was obtained from the patient after a
thorough discussion of the procedural risks, benefits and
alternatives. All questions were addressed. Maximal Sterile Barrier
Technique was utilized including caps, mask, sterile gowns, sterile
gloves, sterile drape, hand hygiene and skin antiseptic. A timeout
was performed prior to the initiation of the procedure.

With the patient prone, the thoracolumbar region was prepped and
draped in the usual sterile fashion. The T9 vertebral artery was
then identified on the AP and lateral views. The skin overlying the
right pedicle was then infiltrated with 0.25% bupivacaine and
carried to the right pedicle.

Using biplane intermittent fluoroscopy, a 13 gauge PINO spinal
needle was then advanced into the posterior [DATE] at T9. Through this,
3 passes were made with a 16 gauge core biopsy needle in different
directions. Using a 20 mL syringe, core biopsies and aspirate was
obtained and sent for surgical pathology analysis.

The needle was removed. Hemostasis was achieved at the skin entry
site. The patient tolerated the procedure well. The patient was then
transferred back to the floor in stable condition.
IMPRESSION: Status post fluoroscopic guided core biopsies x3 at T9 vertebral
body as described.

## 2022-03-01 MED ORDER — FENTANYL CITRATE (PF) 100 MCG/2ML IJ SOLN
INTRAMUSCULAR | Status: AC
  Filled 2022-03-01: qty 2

## 2022-03-01 MED ORDER — SODIUM CHLORIDE 0.9 % IV SOLN: INTRAVENOUS | Status: AC

## 2022-03-01 MED ORDER — MIDAZOLAM HCL 2 MG/2ML IJ SOLN
INTRAMUSCULAR | Status: AC | PRN
  Administered 2022-03-01: .5 mg via INTRAVENOUS
  Administered 2022-03-01: 1 mg via INTRAVENOUS
  Administered 2022-03-01: .5 mg via INTRAVENOUS

## 2022-03-01 MED ORDER — FENTANYL CITRATE (PF) 100 MCG/2ML IJ SOLN
INTRAMUSCULAR | Status: AC | PRN
  Administered 2022-03-01 (×3): 25 ug via INTRAVENOUS

## 2022-03-01 MED ORDER — BUPIVACAINE HCL (PF) 0.5 % IJ SOLN
INTRAMUSCULAR | Status: AC
  Filled 2022-03-01: qty 30

## 2022-03-01 MED ORDER — BUPIVACAINE HCL (PF) 0.25 % IJ SOLN
INTRAMUSCULAR | Status: AC | PRN
  Administered 2022-03-01: 10 mL

## 2022-03-01 MED ORDER — MIDAZOLAM HCL 2 MG/2ML IJ SOLN
INTRAMUSCULAR | Status: AC
  Filled 2022-03-01: qty 2

## 2022-03-01 MED ORDER — LIDOCAINE HCL (PF) 1 % IJ SOLN
INTRAMUSCULAR | Status: AC
  Filled 2022-03-01: qty 30

## 2022-03-01 NOTE — Procedures (Addendum)
INR. Status post  T9 vertebral body core biopsy under fluoroscopy.  3 passes made with a 16-gauge core biopsy needle.   Core samples obtained sent for surgical pathology. Arlean Hopping MD

## 2022-03-01 NOTE — Progress Notes (Signed)
  Transition of Care [facility]) Screening Note   Patient Details  Name: Christopher [name]. Date of Birth: 17-Dec-1960   Transition of Care [facility] CM/SW Contact:    Bartholomew Crews, RN Phone Number: [phone] 03/01/2022, 2:58 PM  Patient had biopsy today.   Transition of Care Department (TOC) following this patient - no TOC needs have been identified at this time. We will continue to monitor patient advancement through interdisciplinary progression rounds. As new patient transition needs arise, please place a TOC consult.

## 2022-03-01 NOTE — Progress Notes (Signed)
PROGRESS NOTE    Christopher [name].  YKZ:[id number] DOB: 09-20-1961 DOA: 02/26/2022 PCP: Patient, No Pcp Per (Inactive)   Brief Narrative:  Pt is a 61 y.o. male with somewhat complex and convoluted history who presents with chief complaint of right flank/paraspinal muscle pain.  He has been previously evaluated at [facility] as well as [facility] for multiple medical complaints including abdominal pain, osteoarthritis dysphagia, odynophagia, intractable abdominal pain with questionable partial obstruction as well as history of diverticulosis/diverticulitis and cholecystitis.  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  Discussed with neurosurgery for possible biopsy of this area on Monday, 03/01/2022 and pain control in the interim.  Assessment & Plan:   Principal Problem:   Back pain Active Problems:   Tonsillar cancer (HCC)   Abdominal pain   Poor dentition  Intractable back/R flank pain Concurrent ambulatory dysfunction -Unclear etiology concern for infectious process versus malignancy or autoimmune, biopsy today, results pending -ID consulted for further delineation, discontinued antibiotics given patient does not meet sepsis criteria and imaging is unremarkable for any overt abscesses/large infectious areas -Lengthy discussion with infectious disease, infection prevention about patient's questionable TB rule out and precautions.  At this time given patient's lack of symptoms, negative imaging and lacking any other concerning features for TB his contact precautions and testing will be discontinued at this time.  Patient's notable weight loss is secondary to his abdominal pain and odynophagia as outlined below, no known sources or contacts  of TB, no out of country travel. -Pain well controlled on current regimen   Chronic abdominal pain/odynophagia -Patient indicates multiple evaluations in the outpatient and inpatient setting previously with GI, most recent endoscopy unremarkable. -Worsening right upper quadrant abdominal pain postprandially over the past 24 hours -Trial GI cocktail and IV PPI -Patient continues to eat heavy greasy fatty salty food (meat loaf, mac & cheese, cheese grits, sausage) -we have discussed attempting to reduce diet to soft bland food   Poor dentition -Previous physician spoke with Dr. Conley Simmonds as sideline, not formal consult, appreciate insight and recommendations -Follow-up outpatient as scheduled -As above lengthy discussion on need for dietary changes, patient continues to consume high sugar/carbonated beverages   H/o tonsillar cancer -No surgical resection -Completed chemo/rads -No evidence of recurrence at last check   Tobacco dependence -Long-term oral tobacco user -He is currently using nicotine replacement products -Continue patch as tolerated  DVT prophylaxis: Early ambulation, SCDs Code Status: Full Family Communication: Daughter and wife at bedside  Status is: Inpatient  Dispo: The patient is from: Home              Anticipated d/c is to: To be determined              Anticipated d/c date is: 48-72h              Patient currently not medically stable for discharge  Consultants:  Neurosurgery, IR, infectious disease  Procedures:  Biopsy pending 03/01/2022  Antimicrobials:  Holding as above  Subjective: Abdominal pain somewhat plateauing, denies nausea vomiting diarrhea headache fevers chills or chest pain.  Objective: Vitals:   02/28/22 0841 02/28/22 1419 02/28/22 2003 03/01/22 0417  BP: 126/78 138/80 (!) 143/90 126/86  Pulse: 65 70 74 77  Resp: _0 Temp: 98.1 F (36.7 C) 98.9 F (  37.2 C) 99.6 F (37.6 C) 97.7 F (36.5 C)  TempSrc: Oral Oral Oral  Oral  SpO2: 97% 99% 98% 97%  Weight:      Height:        Intake/Output Summary (Last 24 hours) at 03/01/2022 0715 Last data filed at 03/01/2022 0600 Gross per 24 hour  Intake 1130 ml  Output --  Net 1130 ml    Filed Weights   02/26/22 1800  Weight: 92.2 kg    Examination:  General:  Pleasantly resting in bed, No acute distress. HEENT:  Normocephalic atraumatic.  Sclerae nonicteric, noninjected.  Extraocular movements intact bilaterally. Neck:  Without mass or deformity.  Trachea is midline. Lungs:  Clear to auscultate bilaterally without rhonchi, wheeze, or rales. Heart:  Regular rate and rhythm.  Without murmurs, rubs, or gallops. Abdomen:  Soft, nontender, nondistended.  Without guarding or rebound. Extremities: Without cyanosis, clubbing, edema, or obvious deformity. Vascular:  Dorsalis pedis and posterior tibial pulses palpable bilaterally. Skin:  Warm and dry, no erythema, no ulcerations.  Data Reviewed: I have personally reviewed following labs and imaging studies  CBC: Recent Labs  Lab 02/26/22 0015 02/27/22 0121  WBC 8.2 6.7  HGB 13.5 12.1*  HCT 38.8* 35.3*  MCV 85.3 85.3  PLT 246 397    Basic Metabolic Panel: Recent Labs  Lab 02/26/22 0015 02/27/22 0121  NA 136 139  K 3.9 3.8  CL 105 106  CO2 22 25  GLUCOSE 117* 87  BUN 20 13  CREATININE 1.05 0.95  CALCIUM 9.5 9.1    GFR: Estimated Creatinine Clearance: 94.6 mL/min (by C-G formula based on SCr of 0.95 mg/dL). Liver Function Tests: Recent Labs  Lab 02/26/22 0015  AST 17  ALT 24  ALKPHOS 72  BILITOT 1.4*  PROT 7.2  ALBUMIN 3.6    Recent Labs  Lab 02/26/22 0015  LIPASE 23    No results for input(s): AMMONIA in the last 168 hours. Coagulation Profile: Recent Labs  Lab 03/01/22 0303  INR 1.1   Cardiac Enzymes: No results for input(s): CKTOTAL, CKMB, CKMBINDEX, TROPONINI in the last 168 hours. BNP (last 3 results) No results for input(s): PROBNP in the last 8760  hours. HbA1C: No results for input(s): HGBA1C in the last 72 hours. CBG: No results for input(s): GLUCAP in the last 168 hours. Lipid Profile: No results for input(s): CHOL, HDL, LDLCALC, TRIG, CHOLHDL, LDLDIRECT in the last 72 hours. Thyroid Function Tests: No results for input(s): TSH, T4TOTAL, FREET4, T3FREE, THYROIDAB in the last 72 hours. Anemia Panel: No results for input(s): VITAMINB12, FOLATE, FERRITIN, TIBC, IRON, RETICCTPCT in the last 72 hours. Sepsis Labs: No results for input(s): PROCALCITON, LATICACIDVEN in the last 168 hours.  Recent Results (from the past 240 hour(s))  Culture, blood (Routine X 2) w Reflex to ID Panel     Status: None (Preliminary result)   Collection Time: 02/26/22  5:20 AM   Specimen: BLOOD  Result Value Ref Range Status   Specimen Description BLOOD SITE NOT SPECIFIED  Final   Special Requests   Final    BOTTLES DRAWN AEROBIC AND ANAEROBIC Blood Culture adequate volume   Culture   Final    NO GROWTH 3 DAYS Performed at [facility], [address], [place], [place] [address]    Report Status PENDING  Incomplete  Culture, blood (Routine X 2) w Reflex to ID Panel     Status: None (Preliminary result)   Collection Time: 02/26/22  5:25 AM  Specimen: BLOOD  Result Value Ref Range Status   Specimen Description BLOOD SITE NOT SPECIFIED  Final   Special Requests   Final    BOTTLES DRAWN AEROBIC AND ANAEROBIC Blood Culture adequate volume   Culture   Final    NO GROWTH 3 DAYS Performed at [facility], [address], [place], [place] [address]    Report Status PENDING  Incomplete  Surgical PCR screen     Status: None   Collection Time: 02/26/22 10:20 PM   Specimen: Nasal Mucosa; Nasal Swab  Result Value Ref Range Status   MRSA, PCR NEGATIVE NEGATIVE Final   Staphylococcus aureus NEGATIVE NEGATIVE Final    Comment: (NOTE) The Xpert SA Assay (FDA approved for NASAL specimens in patients 57 years of age and older), is one component  of a comprehensive surveillance program. It is not intended to diagnose infection nor to guide or monitor treatment. Performed at [facility], [place] [address] [place], [place] [address]      Radiology Studies: No results found.  Scheduled Meds:  feeding supplement  237 mL Oral BID BM   gabapentin  600 mg Oral QHS   multivitamin with minerals  1 tablet Oral Daily   mupirocin ointment  1 application. Nasal BID   nicotine  14 mg Transdermal Daily   pantoprazole (PROTONIX) IV  40 mg Intravenous Q12H   sodium chloride flush  3 mL Intravenous Q12H   tamsulosin  0.4 mg Oral Daily   Continuous Infusions:  lactated ringers 75 mL/hr at 03/01/22 0626     LOS: 3 days   Time spent: 67mn  Larone Kliethermes C Chad Donoghue, DO [facility]  If 7PM-7AM, please contact night-coverage [email or web address]  03/01/2022, 7:15 AM

## 2022-03-02 DIAGNOSIS — R1011 Right upper quadrant pain: Secondary | ICD-10-CM | POA: Diagnosis not present

## 2022-03-02 DIAGNOSIS — K089 Disorder of teeth and supporting structures, unspecified: Secondary | ICD-10-CM | POA: Diagnosis not present

## 2022-03-02 DIAGNOSIS — M545 Low back pain, unspecified: Secondary | ICD-10-CM | POA: Diagnosis not present

## 2022-03-02 DIAGNOSIS — C099 Malignant neoplasm of tonsil, unspecified: Secondary | ICD-10-CM | POA: Diagnosis not present

## 2022-03-02 LAB — SURGICAL PATHOLOGY

## 2022-03-02 MED ORDER — POLYETHYLENE GLYCOL 3350 17 G PO PACK: 17.0000 g | PACK | Freq: Once | ORAL | Status: DC

## 2022-03-02 NOTE — Discharge Summary (Signed)
Physician Discharge Summary  Christopher [name]. PZW:[id number] DOB: 07-02-1961 DOA: 02/26/2022  PCP: Gweneth Fritter, FNP  Admit date: 02/26/2022 Discharge date: 03/02/2022  Admitted From: Home Disposition: Home  Recommendations for Outpatient Follow-up:  Follow up with PCP in 1-2 weeks Please obtain BMP/CBC in one week Please follow up on the following pending results:  Discharge Condition: Stable CODE STATUS: Full Diet recommendation: Low-salt low-fat low residual diet  Brief/Interim Summary: Pt is a 61 y.o. male with somewhat complex and convoluted history who presents with chief complaint of right flank/paraspinal muscle pain.  He has been previously evaluated at [facility] as well as [facility] for multiple medical complaints including abdominal pain, osteoarthritis dysphagia, odynophagia, intractable abdominal pain with questionable partial obstruction as well as history of diverticulosis/diverticulitis and cholecystitis.   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  Discussed with neurosurgery for possible biopsy of this area on Monday, 03/01/2022 and pain control in the interim.  Patient tolerated biopsy well, unfortunately pathology results were inconclusive.  Patient will need close outpatient follow-up with infectious disease PCP as well as GI given his ongoing symptoms including abdominal pain which appear to be postprandial but not related to his specific meal type timing and were not improved with PPI or GI cocktail.  We discussed possible partial obstructive process not visualized on endoscopy or CT but again this would need to be followed outpatient to ensure no ongoing issues with food, medication or other intolerances or  allergies.    Discharge Diagnoses:  Principal Problem:   Back pain Active Problems:   Tonsillar cancer (HCC)   Abdominal pain   Poor dentition    Discharge Instructions  Discharge Instructions     Discharge patient   Complete by: As directed    Discharge disposition: 01-Home or Self Care   Discharge patient date: 03/02/2022      Allergies as of 03/02/2022       Reactions   Losartan Cough        Medication List     TAKE these medications    diclofenac 75 MG EC tablet Commonly known as: VOLTAREN Take 75 mg by mouth 2 (two) times daily.   gabapentin 600 MG tablet Commonly known as: NEURONTIN Take 600 mg by mouth at bedtime.   tamsulosin 0.4 MG Caps capsule Commonly known as: FLOMAX Take 0.4 mg by mouth daily.   traMADol 50 MG tablet Commonly known as: ULTRAM Take 50-100 mg by mouth every 6 (six) hours as needed for moderate pain.        Allergies  Allergen Reactions   Losartan Cough    Consultations: Infectious disease, interventional radiology  Procedures/Studies: DG Orthopantogram  Result Date: 02/26/2022 CLINICAL DATA:  Known spinal infection, evaluate for possible source EXAM: ORTHOPANTOGRAM/PANORAMIC COMPARISON:  None Available. FINDINGS: Panoramic view of the mandible was obtained and reveals significant dental hardware. Dental caries are noted at the base of the first and second molars on the left in the maxilla. No periapical lucency to suggest abscess is noted. No other focal abnormality is seen. IMPRESSION: Dental caries and prior dental hardware. No evidence of periapical abscess is seen. Electronically Signed   By: Inez Catalina M.D.   On: 02/26/2022 15:18   MR THORACIC SPINE W WO CONTRAST  Result Date: 02/26/2022 CLINICAL DATA:  61 year old  male with mid back pain since last month. Anterior endplate erosions at F02-O37 on CTA suspicious for osteomyelitis. 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 EXAM: MRI THORACIC  WITHOUT AND WITH CONTRAST TECHNIQUE: Multiplanar and multiecho pulse sequences of the thoracic spine were obtained without and with intravenous contrast. CONTRAST:  45m GADAVIST GADOBUTROL 1 MMOL/ML IV SOLN COMPARISON:  CTA abdomen and pelvis 0221 hours today. CT Abdomen and Pelvis 01/28/2022. Neck CT 08/18/2020. Thoracic spine radiographs 02/02/2022. FINDINGS: Limited cervical spine imaging: Chronic C5-C6 disc and endplate degeneration grossly stable since 2021. Thoracic spine segmentation: Evidence of hypoplastic or absent ribs at T12. Alignment: Stable thoracic kyphosis from the radiographs last month. Vertebrae: Confluent abnormal marrow edema and enhancement in the T9 and T10 vertebral bodies, especially anteriorly where CTA this morning demonstrates bulky anterior endplate osteophytes have been partially eroded since the CT on 01/28/2022 (series 34 image 13 of this exam). There is associated prevertebral and bilateral paraspinal soft tissue thickening and enhancement (series 31, image 30), however, the intervening T9-T10 disc space seems to remain relatively normal. No posterior paraspinal soft tissue inflammation. No discrete fluid collection. And the epidural space here also seems to remain normal. There is also faint marrow edema and enhancement in the right superior T12 body on series 29, image 8, and perhaps early T11 anterior inferior endplate edema also. But no other paraspinal soft tissue abnormality. Anterior endplate osteophytes at T7 and T8 appear to remain normal. Background bone marrow signal is normal. Benign posterior element hemangioma on the left at T5. Cord: Normal. Conus medullaris mostly visible at T12-L1. No abnormal intradural enhancement or dural thickening. Paraspinal and other soft tissues: Abnormal ventral and lateral paraspinal soft tissues at T9-T10 detailed above. Negative elsewhere. Visible chest remarkable trick 4 trace layering right pleural effusion and mild nonspecific  dependent lung base opacity. Grossly negative visible mediastinum and upper abdominal viscera. Disc levels: No age advanced thoracic disc degeneration. Intermittent thoracic posterior element hypertrophy, including at the T9-T10 level greater on the left (series 30, image 28). But no significant thoracic spinal stenosis. No significant foraminal stenosis. IMPRESSION: 1. Transitional spinal anatomy. Hypoplastic ribs at T12. When counting from the skull base the abnormal anterior thoracic spinal segment is T9-T10. 2. Confluent abnormal edema and enhancement of the anterior T9-T10 bodies where partial erosion of anterior endplate osteophytes has occurred by CT since 01/28/2022. Bulky associated ventral and lateral paraspinal soft tissue thickening and enhancement, however, the intervening T9-T10 disc remains relatively normal. Epidural space remains normal. And there is no abscess or fluid collection. Questionable early abnormal T12 vertebral body edema and enhancement. Therefore, differential considerations include: - unusual Osteomyelitis with little to no associated discitis. Recommend blood cultures. - lytic Tumor or Metastasis with extraosseous extension. 3. Mild for age thoracic spine degeneration (posterior element hypertrophy) with no spinal or foraminal stenosis. 4. Trace right pleural effusion. Electronically Signed   By: HGenevie AnnM.D.   On: 02/26/2022 07:38   MR Lumbar Spine W Wo Contrast  Result Date: 02/26/2022 CLINICAL DATA:  61year old male with mid back pain since last month. Anterior endplate erosions at TC58-I50on CTA suspicious for osteomyelitis. 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 EXAM: MRI LUMBAR SPINE WITHOUT AND WITH CONTRAST TECHNIQUE: Multiplanar and multiecho pulse sequences of the lumbar spine were obtained without and with intravenous contrast. CONTRAST:  172mGADAVIST GADOBUTROL 1 MMOL/ML IV SOLN COMPARISON:  Thoracic spine MRI today reported separately. CTA  abdomen and pelvis 0221  hours today. FINDINGS: Segmentation: Transitional anatomy, as demonstrated on the thoracic MRI today. Fully sacralized L5 level. Correlation with radiographs is recommended prior to any operative intervention. Alignment: Mild levoconvex lumbar scoliosis. Relatively preserved lumbar lordosis. No significant spondylolisthesis. Vertebrae: Redemonstrated faint abnormal marrow edema and enhancement in the right superior T12 vertebral body as questioned on the thoracic MRI today (series 7, image 7 and series 2, image 7). But no other marrow edema or enhancement. Background bone marrow signal is within normal limits. Intact visible sacrum and SI joints. Conus medullaris and cauda equina: Conus extends to the T12-L1 level. No lower spinal cord or conus signal abnormality. No abnormal intradural enhancement. No dural thickening. Capacious lumbar spinal canal. Cauda equina nerve roots appear normal. Paraspinal and other soft tissues: Negative. Disc levels: Similar to the thoracic MRI today, lumbar disc degeneration is generally mild for age and there is intermittent lumbar posterior element hypertrophy. No lumbar spinal stenosis. Small central disc protrusion at L4-L5 does not result in lateral recess stenosis. There is mild degenerative neural foraminal stenosis at the left L2, left L3, bilateral L4 nerve levels. L5-S1 is completely sacralized. IMPRESSION: 1. Transitional anatomy with fully sacralized L5 level when numbering from the skull base. Correlation with radiographs is recommended prior to any operative intervention. 2. Confirmed faint abnormal marrow edema and enhancement in the superior T12 vertebral body on the right. See associated abnormalities detailed on Thoracic MRI today. 3. But negative for age MRI appearance of the Lumbar spine. Mild degenerative lumbar neural foraminal stenosis. Electronically Signed   By: Genevie Ann M.D.   On: 02/26/2022 07:44   CT L-SPINE NO CHARGE  Result Date:  02/26/2022 CLINICAL DATA:  Back pain. EXAM: CT LUMBAR SPINE WITHOUT CONTRAST TECHNIQUE: Multidetector CT imaging of the lumbar spine was performed without intravenous contrast administration. Multiplanar CT image reconstructions were also generated. RADIATION DOSE REDUCTION: This exam was performed according to the departmental dose-optimization program which includes automated exposure control, adjustment of the mA and/or kV according to patient size and/or use of iterative reconstruction technique. COMPARISON:  CT of abdomen and pelvis no contrast 01/28/2022, CT chest with contrast 07/19/2018. FINDINGS: Segmentation: There are hypoplastic T12 ribs (confirmed on the prior chest CT) and only 4 non-rib-bearing lumbar segments. There is no transitional segment. Alignment: There is mild levoscoliosis. Apex is at L1-2. There is no AP listhesis. Vertebrae: There are moderate features of spondylosis. The vertebra are normal in heights. No fracture or focal lesion is seen. Paraspinal and other soft tissues: Negative apart from mild aortic atherosclerosis. Disc levels: There is mild disc space loss at L4-S1. Above L4 the discs are normal in heights. Above L3 no significant disc bulge is seen or herniations. There is mild facet spurring at L1-2, moderate asymmetric right facet hypertrophy L2-3, and bilateral moderate facet hypertrophy at L3-4 and L4-S1. There is slight nonstenosing posterior disc bulge at L3-4. At L4-S1 there is left paracentral shallow disc protrusion with overlying annular calcification and slight compression of the left S1 nerve root. Due to facet hypertrophy, at L3-4 there is moderate left foraminal stenosis and at L4-S1 there is moderate right and mild-to-moderate left foraminal stenosis. IMPRESSION: 1. Degenerative changes and mild levoscoliosis without evidence of fractures or focal bone lesion. 2. Shallow chronic left paracentral herniated disc L4-S1 with mild compression of the left S1 nerve root.  3. 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 4. CTA abdomen and pelvis today demonstrated peridiscal endplate erosions and  fragmentation anteriorly at T9-10, above the level of the current study. Findings consistent with spondylodiscitis. Electronically Signed   By: Telford Nab M.D.   On: 02/26/2022 03:32   CT Angio Abd/Pel w/ and/or w/o  Addendum Date: 02/26/2022   ADDENDUM REPORT: 02/26/2022 03:34 ADDENDUM: The patient only has 4 lumbar segments with hypoplastic ribs at T12. This is confirmed on prior chest CT from 2019. The anterior endplate erosions described in this report are therefore actually at T9-10, not at T10-11. Electronically Signed   By: Telford Nab M.D.   On: 02/26/2022 03:34   Result Date: 02/26/2022 CLINICAL DATA:  Renal artery stenosis. Recent right renal artery thrombus. Presents with right abdominal pain. EXAM: CTA ABDOMEN AND PELVIS WITHOUT AND WITH CONTRAST TECHNIQUE: Multidetector CT imaging of the abdomen and pelvis was performed using the standard protocol during bolus administration of intravenous contrast. Multiplanar reconstructed images and MIPs were obtained and reviewed to evaluate the vascular anatomy. RADIATION DOSE REDUCTION: This exam was performed according to the departmental dose-optimization program which includes automated exposure control, adjustment of the mA and/or kV according to patient size and/or use of iterative reconstruction technique. CONTRAST:  131m OMNIPAQUE IOHEXOL 350 MG/ML SOLN COMPARISON:  CTA abdomen and pelvis and reconstructions of 01/23/2022, CT abdomen and pelvis no contrast 01/28/2022. FINDINGS: VASCULAR Aorta: Small amount of scattered mixed plaque. No aneurysm, dissection or stenosis. Celiac: Patent without evidence of aneurysm, dissection, vasculitis or significant stenosis. There are Trace ostial calcifications which are nonstenosing. SMA: Patent without evidence of aneurysm, dissection,  vasculitis or significant stenosis. Renals: Both renal arteries are patent without evidence of aneurysm, dissection, vasculitis, fibromuscular dysplasia or significant stenosis. Left renal artery is single. On the right there is a hypoplastic artery to the lower pole arising just above the main artery. Right renal artery thrombus is no longer seen. IMA: Patent without evidence of aneurysm, dissection, vasculitis or significant stenosis. Inflow: Patent without evidence of aneurysm, dissection, vasculitis or significant stenosis. There is minimal calcification in the distal left common iliac artery. No other iliac arterial calcific plaques are seen. Proximal Outflow: Bilateral common femoral and visualized portions of the superficial and profunda femoral arteries are patent without evidence of aneurysm, dissection, vasculitis or significant stenosis. Veins: Clear. Review of the MIP images confirms the above findings. NON-VASCULAR Lower chest: No acute abnormality. There is a trace right pleural effusion. Subsegmental atelectasis both bases. The cardiac size is normal. Hepatobiliary: There is a mildly steatotic and otherwise unremarkable liver. The gallbladder is absent without biliary dilatation. Pancreas: Partial fatty replacement in the head and proximal neck. No mass enhancement or ductal dilatation. Spleen: No mass enhancement. Mild splenomegaly with splenic AP diameter 14.6 cm Adrenals/Urinary Tract: Chronic perinephric stranding. No adrenal mass or renal mass enhancement. No calculus or hydronephrosis. Unremarkable bladder. Stomach/Bowel: No dilatation or wall thickening. An appendix is not seen. There is fluid throughout the colon but no inflammatory changes. Scattered uncomplicated colonic diverticula. Lymphatic: No adenopathy is seen. Reproductive: There is no prostatomegaly. Other: Small umbilical and inguinal fat hernias. There is no incarcerated hernia. There is no free air, hemorrhage or fluid.  Musculoskeletal: There is new demonstration of peridiscal endplate erosions and bony fragmentation anteriorly at T10-11 and adjacent paraspinal soft tissue stranding most likely due to phlegmon. No paraspinal abscess is seen. Findings strongly suspect spondylodiscitis. There are degenerative changes in the thoracic and lumbar spine without further new abnormality. There is mild lumbar levoscoliosis apex at L2. There is mild hip DJD. IMPRESSION: VASCULAR 1.  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 2. Scattered aortic atherosclerosis but no stenosis, aneurysm or dissection in the aorta and branch arteries. NON-VASCULAR 1. Fluid in the colon but no wall thickening or inflammatory changes. Findings may suggest a nonspecific diarrheal illness. 2. Interval new finding of peridiscal endplate erosions anteriorly with bony fragmentation at T10-11 with adjacent soft tissue reaction most likely due to phlegmon. 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 No epidural extension of the process is suspected at this time. 3. Trace right pleural effusion. 4. Mildly prominent spleen. 5. Small umbilical and inguinal fat hernias. 6. Additional findings described above. Electronically Signed: By: Telford Nab M.D. On: 02/26/2022 03:10     Subjective: No acute issues or events overnight   Discharge Exam: Vitals:   03/02/22 0448 03/02/22 0741  BP: 122/69 131/81  Pulse: 67 66  Resp: 16 16  Temp:  98.2 F (36.8 C)  SpO2: 97% 93%   Vitals:   03/01/22 1150 03/01/22 1951 03/02/22 0448 03/02/22 0741  BP: 126/78 135/82 122/69 131/81  Pulse: 64 65 67 66  Resp: '17 19 16 16  '$ Temp:  98.9 F (37.2 C)  98.2 F (36.8 C)  TempSrc:  Oral  Oral  SpO2: 99% 96% 97% 93%  Weight:      Height:        General: Pt is alert, awake, not in acute distress Cardiovascular: RRR, S1/S2 +, no rubs, no gallops Respiratory: CTA bilaterally, no wheezing, no rhonchi Abdominal: Soft, NT, ND, bowel sounds  + Extremities: no edema, no cyanosis    The results of significant diagnostics from this hospitalization (including imaging, microbiology, ancillary and laboratory) are listed below for reference.     Microbiology: Recent Results (from the past 240 hour(s))  Culture, blood (Routine X 2) w Reflex to ID Panel     Status: None (Preliminary result)   Collection Time: 02/26/22  5:20 AM   Specimen: BLOOD  Result Value Ref Range Status   Specimen Description BLOOD SITE NOT SPECIFIED  Final   Special Requests   Final    BOTTLES DRAWN AEROBIC AND ANAEROBIC Blood Culture adequate volume   Culture   Final    NO GROWTH 4 DAYS Performed at [facility], [address], [place], [place] [address]    Report Status PENDING  Incomplete  Culture, blood (Routine X 2) w Reflex to ID Panel     Status: None (Preliminary result)   Collection Time: 02/26/22  5:25 AM   Specimen: BLOOD  Result Value Ref Range Status   Specimen Description BLOOD SITE NOT SPECIFIED  Final   Special Requests   Final    BOTTLES DRAWN AEROBIC AND ANAEROBIC Blood Culture adequate volume   Culture   Final    NO GROWTH 4 DAYS Performed at [facility], [address], [place], [place] [address]    Report Status PENDING  Incomplete  Surgical PCR screen     Status: None   Collection Time: 02/26/22 10:20 PM   Specimen: Nasal Mucosa; Nasal Swab  Result Value Ref Range Status   MRSA, PCR NEGATIVE NEGATIVE Final   Staphylococcus aureus NEGATIVE NEGATIVE Final    Comment: (NOTE) The Xpert SA Assay (FDA approved for NASAL specimens in patients 41 years of age and older), is one component of a comprehensive surveillance program. It is not intended to diagnose infection nor to guide or monitor treatment. Performed at [facility], [place],  [place] [address]      Labs: BNP (last 3 results) No results for input(s): BNP in the last 8760 hours. Basic Metabolic Panel: Recent Labs  Lab  02/26/22 0015 02/27/22 0121  NA 136 139  K 3.9 3.8  CL 105 106  CO2 22 25  GLUCOSE 117* 87  BUN 20 13  CREATININE 1.05 0.95  CALCIUM 9.5 9.1   Liver Function Tests: Recent Labs  Lab 02/26/22 0015  AST 17  ALT 24  ALKPHOS 72  BILITOT 1.4*  PROT 7.2  ALBUMIN 3.6   Recent Labs  Lab 02/26/22 0015  LIPASE 23   No results for input(s): AMMONIA in the last 168 hours. CBC: Recent Labs  Lab 02/26/22 0015 02/27/22 0121  WBC 8.2 6.7  HGB 13.5 12.1*  HCT 38.8* 35.3*  MCV 85.3 85.3  PLT 246 220   Cardiac Enzymes: No results for input(s): CKTOTAL, CKMB, CKMBINDEX, TROPONINI in the last 168 hours. BNP: Invalid input(s): POCBNP CBG: No results for input(s): GLUCAP in the last 168 hours. D-Dimer No results for input(s): DDIMER in the last 72 hours. Hgb A1c No results for input(s): HGBA1C in the last 72 hours. Lipid Profile No results for input(s): CHOL, HDL, LDLCALC, TRIG, CHOLHDL, LDLDIRECT in the last 72 hours. Thyroid function studies No results for input(s): TSH, T4TOTAL, T3FREE, THYROIDAB in the last 72 hours.  Invalid input(s): FREET3 Anemia work up No results for input(s): VITAMINB12, FOLATE, FERRITIN, TIBC, IRON, RETICCTPCT in the last 72 hours. Urinalysis    Component Value Date/Time   COLORURINE YELLOW 02/26/2022 0013   APPEARANCEUR CLEAR 02/26/2022 0013   LABSPEC 1.017 02/26/2022 0013   PHURINE 5.0 02/26/2022 0013   GLUCOSEU NEGATIVE 02/26/2022 0013   HGBUR SMALL (A) 02/26/2022 0013   BILIRUBINUR NEGATIVE 02/26/2022 0013   KETONESUR 20 (A) 02/26/2022 0013   PROTEINUR NEGATIVE 02/26/2022 0013   NITRITE NEGATIVE 02/26/2022 0013   LEUKOCYTESUR NEGATIVE 02/26/2022 0013   Sepsis Labs Invalid input(s): PROCALCITONIN,  WBC,  LACTICIDVEN Microbiology Recent Results (from the past 240 hour(s))  Culture, blood (Routine X 2) w Reflex to ID Panel     Status: None (Preliminary result)   Collection Time: 02/26/22  5:20 AM   Specimen: BLOOD  Result Value Ref  Range Status   Specimen Description BLOOD SITE NOT SPECIFIED  Final   Special Requests   Final    BOTTLES DRAWN AEROBIC AND ANAEROBIC Blood Culture adequate volume   Culture   Final    NO GROWTH 4 DAYS Performed at [facility], [place] [address] [place], [place] [address]    Report Status PENDING  Incomplete  Culture, blood (Routine X 2) w Reflex to ID Panel     Status: None (Preliminary result)   Collection Time: 02/26/22  5:25 AM   Specimen: BLOOD  Result Value Ref Range Status   Specimen Description BLOOD SITE NOT SPECIFIED  Final   Special Requests   Final    BOTTLES DRAWN AEROBIC AND ANAEROBIC Blood Culture adequate volume   Culture   Final    NO GROWTH 4 DAYS Performed at [facility], [address] [place], [place] [address]    Report Status PENDING  Incomplete  Surgical PCR screen     Status: None   Collection Time: 02/26/22 10:20 PM   Specimen: Nasal Mucosa; Nasal Swab  Result Value Ref Range Status   MRSA, PCR NEGATIVE NEGATIVE Final   Staphylococcus aureus NEGATIVE NEGATIVE Final    Comment: (  NOTE) The Xpert SA Assay (FDA approved for NASAL specimens in patients 69 years of age and older), is one component of a comprehensive surveillance program. It is not intended to diagnose infection nor to guide or monitor treatment. Performed at [facility], [place] [address], [place], [place] [address]      Time coordinating discharge: Over 30 minutes  SIGNED:   Little Ishikawa, DO [facility] 03/02/2022, 6:07 PM Pager   If 7PM-7AM, please contact night-coverage [email or web address]

## 2022-03-03 LAB — CULTURE, BLOOD (ROUTINE X 2)
Culture: NO GROWTH
Culture: NO GROWTH
Special Requests: ADEQUATE
Special Requests: ADEQUATE

## 2022-03-04 LAB — QUANTIFERON-TB GOLD PLUS (RQFGPL)
QuantiFERON Mitogen Value: >10 IU/mL
QuantiFERON Nil Value: 0 IU/mL
QuantiFERON TB1 Ag Value: 0.05 IU/mL
QuantiFERON TB2 Ag Value: 0.01 IU/mL

## 2022-03-04 LAB — QUANTIFERON-TB GOLD PLUS: QuantiFERON-TB Gold Plus: NEGATIVE

## 2022-03-31 ENCOUNTER — Encounter: Payer: Self-pay | Admitting: Infectious Diseases

## 2022-03-31 ENCOUNTER — Ambulatory Visit (INDEPENDENT_AMBULATORY_CARE_PROVIDER_SITE_OTHER): Payer: 59 | Admitting: Infectious Diseases

## 2022-03-31 ENCOUNTER — Other Ambulatory Visit: Payer: Self-pay

## 2022-03-31 VITALS — BP 148/95 | HR 65 | Temp 98.2°F | Wt 196.0 lb

## 2022-03-31 DIAGNOSIS — K029 Dental caries, unspecified: Secondary | ICD-10-CM | POA: Diagnosis not present

## 2022-03-31 DIAGNOSIS — M4640 Discitis, unspecified, site unspecified: Secondary | ICD-10-CM

## 2022-03-31 NOTE — Progress Notes (Signed)
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     gabapentin (NEURONTIN) 600 MG tablet Take 600 mg by mouth at bedtime.     traMADol (ULTRAM) 50 MG tablet Take 50-100 mg by mouth every 6 (six) hours as needed for moderate pain.     No current facility-administered medications on file prior to visit.    Subjective: Here for HFU for abnormal findings seen in MRI spine. Patient had IR guided by biopsy onf T9 vertebral body on 5/22. Path was inconclusive for infection vs malignancy. Unfortunately cx was not sent and tissue could not be send to [facility] for PCR as it was already in formalin per colleague. Patient was discharged without antibiotics on 03/02/22. Cause of flank pain, back and paraspinal pain and abdominal pain was unclear during the hospitalization and it was recommended to closely follow with PCP, ID and GI.   After hospital discharge, he has completed 2 separate courses of 7 days of amoxicillin-clavulanate in a span of 3 weeks for presumed diverticulitis by PCP ( however did not have diarrhea, fevers, abdominal pain).  He felt improvement in flank and back pain when he took abtx. Back pain has improved from the time when he was in the hospital but still has back pain when he strains his back. Denies fevers, chills, previously reported night sweats has resolved. Appetite is good and no changes in weight. Denies nausea, vomiting and diarrhea. He is also concerned that his back might have been strained as he has to do a lot of pushing/pulling and carrying heavy goods at work.  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   Past Surgical History:  Procedure Laterality Date   CHOLECYSTECTOMY     IR FLUORO GUIDED NEEDLE PLC ASPIRATION/INJECTION LOC  03/01/2022    Social History   Tobacco Use   Smoking status: Never   Smokeless tobacco: Current    Types: Snuff   Tobacco comments:    Uses nicotine replacement pouch  Substance Use Topics   Alcohol use: Yes    Alcohol/week: 2.0 standard drinks of alcohol    Types: 2 Cans of beer per week    Comment: rare   Drug use: Not Currently    Family History  Problem Relation Age of Onset   Alcoholism Mother    Alcoholism Father     Allergies  Allergen Reactions   Losartan Cough    Health Maintenance  Topic Date Due   Hepatitis C Screening  Never done   Zoster Vaccines- Shingrix (1 of 2) Never done   COLONOSCOPY (Pts 45-65yr Insurance coverage will need to be confirmed)  Never done   COVID-19 Vaccine (3 - Moderna risk series) 02/21/2020   INFLUENZA VACCINE  05/11/2022   TETANUS/TDAP  04/30/2029   HIV Screening  Completed   HPV VACCINES  Aged Out    Objective: BP (!) 148/95  Pulse 65   Temp 98.2 F (36.8 C) (Oral)   Wt 196 lb (88.9 kg)   BMI 25.86 kg/m    Physical Exam Constitutional:      Appearance: Normal appearance.  HENT:     Head: Normocephalic and atraumatic.      Mouth: Mucous membranes are moist.  Eyes:    Conjunctiva/sclera: Conjunctivae normal.     Pupils: Pupils are equal, round, and reactive to light.   Cardiovascular:     Rate and Rhythm: Normal rate and regular rhythm.     Heart sounds: No murmur heard. No friction rub. No gallop.   Pulmonary:     Effort: Pulmonary effort is normal.     Breath sounds: Normal breath sounds.   Abdominal:     General: Non distended     Palpations: soft.   Musculoskeletal:        General: Normal range of motion.    Skin:    General: Skin is warm and dry.     Comments:  Neurological:     General: grossly non focal     Mental Status: awake, alert and oriented to person, place, and time. No Vertebral spine tenderness  Psychiatric:        Mood and Affect: Mood normal.   Lab Results Lab Results  Component Value Date   WBC 6.7 02/27/2022   HGB 12.1 (L) 02/27/2022   HCT 35.3 (L) 02/27/2022   MCV 85.3 02/27/2022   PLT 220 02/27/2022    Lab Results  Component Value Date   CREATININE 0.95 02/27/2022   BUN 13 02/27/2022   NA 139 02/27/2022   K 3.8 02/27/2022   CL 106 02/27/2022   CO2 25 02/27/2022    Lab Results  Component Value Date   ALT 24 02/26/2022   AST 17 02/26/2022   ALKPHOS 72 02/26/2022   BILITOT 1.4 (H) 02/26/2022    No results found for: "CHOL", "HDL", "LDLCALC", "LDLDIRECT", "TRIG", "CHOLHDL" No results found for: "LABRPR", "RPRTITER" No results found for: "HIV1RNAQUANT", "HIV1RNAVL", "CD4TABS"   Pathology 03/01/22 FINAL MICROSCOPIC DIAGNOSIS:   A. BONE, T9, BIOPSY:  -  Fragments of hematopoietic marrow with trilineage hematopoiesis in  the background of abundant blood.  -  No extrinsic cells are identified    Problem List Items Addressed This Visit       Digestive   Dental caries     Musculoskeletal and Integument   Spondylodiscitis - Primary    Assessment/Plan  Abnormal MRI T spine in the setting of back/flank pain Dental Caries  H/o Tonsillar ca  Repeat MRI T L spine wwo contrast to follow up on previously seen abnormality. Low suspicion for infective cause currently although cx were not taken for confirmation.  CBC, ESR and CRP  Fu to be made after MRI T L spine  Fu with Dentist for dental care Fu with GI as recommended   I have personally spent 40 minutes involved in face-to-face and non-face-to-face activities for this patient on the day of the visit. Professional time spent includes the following activities: Preparing to see the patient (review  of tests), Obtaining and/or reviewing separately obtained history (admission/discharge record), Performing a medically appropriate examination and/or evaluation , Ordering medications/tests/procedures, referring and communicating with other health care professionals, Documenting clinical information in the EMR, Independently interpreting results (not separately reported), Communicating results to the patient/family/caregiver, Counseling and educating the patient/family/caregiver and Care coordination (not separately reported).   Wilber Oliphant, [place] Infectious Disease  [facility] 03/31/2022, 2:17 PM

## 2022-04-01 ENCOUNTER — Telehealth: Payer: Self-pay

## 2022-04-01 LAB — CBC
HCT: 39.8 % (ref 38.5–50.0)
Hemoglobin: 13.5 g/dL (ref 13.2–17.1)
MCH: 28.9 pg (ref 27.0–33.0)
MCHC: 33.9 g/dL (ref 32.0–36.0)
MCV: 85.2 fL (ref 80.0–100.0)
MPV: 9.8 fL (ref 7.5–12.5)
Platelets: 182 10*3/uL (ref 140–400)
RBC: 4.67 10*6/uL (ref 4.20–5.80)
RDW: 14 % (ref 11.0–15.0)
WBC: 5.3 10*3/uL (ref 3.8–10.8)

## 2022-04-01 LAB — C-REACTIVE PROTEIN: CRP: 1.7 mg/L (ref <8.0)

## 2022-04-01 LAB — SEDIMENTATION RATE: Sed Rate: 6 mm/h (ref 0–20)

## 2022-04-01 NOTE — Telephone Encounter (Signed)
-----   Message from Rosiland Oz, MD sent at 04/01/2022  2:24 PM EDT ----- Please let patient know labs look OK.

## 2022-04-09 ENCOUNTER — Ambulatory Visit (HOSPITAL_COMMUNITY)
Admission: RE | Admit: 2022-04-09 | Discharge: 2022-04-09 | Disposition: A | Discharge status: Home / Self Care | Payer: 59 | Source: Ambulatory Visit | Attending: Infectious Diseases | Admitting: Infectious Diseases

## 2022-04-09 DIAGNOSIS — M4640 Discitis, unspecified, site unspecified: Secondary | ICD-10-CM | POA: Insufficient documentation

## 2022-04-09 MED ORDER — GADOBUTROL 1 MMOL/ML IV SOLN
9.0000 mL | Freq: Once | INTRAVENOUS | Status: AC | PRN
  Administered 2022-04-09: 9 mL via INTRAVENOUS

## 2022-04-14 ENCOUNTER — Encounter: Payer: Self-pay | Admitting: Infectious Diseases

## 2022-04-14 ENCOUNTER — Telehealth: Payer: Self-pay

## 2022-04-14 ENCOUNTER — Ambulatory Visit (INDEPENDENT_AMBULATORY_CARE_PROVIDER_SITE_OTHER): Payer: 59 | Admitting: Internal Medicine

## 2022-04-14 ENCOUNTER — Other Ambulatory Visit: Payer: Self-pay

## 2022-04-14 ENCOUNTER — Encounter: Payer: Self-pay | Admitting: Internal Medicine

## 2022-04-14 VITALS — BP 134/92 | HR 77 | Temp 98.6°F | Wt 200.0 lb

## 2022-04-14 DIAGNOSIS — M4620 Osteomyelitis of vertebra, site unspecified: Secondary | ICD-10-CM

## 2022-04-14 MED ORDER — DOXYCYCLINE HYCLATE 100 MG PO TABS: 100.0000 mg | ORAL_TABLET | Freq: Two times a day (BID) | ORAL | 1 refills | Status: DC

## 2022-04-14 MED ORDER — CEFADROXIL 500 MG PO CAPS: 500.0000 mg | ORAL_CAPSULE | Freq: Two times a day (BID) | ORAL | 1 refills | Status: DC

## 2022-04-14 NOTE — Telephone Encounter (Signed)
-----   Message from Rosiland Oz, MD sent at 04/14/2022  8:17 AM EDT ----- Team, let him know that his MRI L spine is abnormal with concerns for discitis and osteomyelitis.   I want him to be seen in the clinic for deciding next steps moving forward ( repeat lumbar biopsy, empiric antibiotics or clinical monitoring).   I am at [facility] all this week. Please have him be seen by one of the providers this week for deciding next steps   Thx

## 2022-04-14 NOTE — Patient Instructions (Signed)
I agree lets treat this like infection    Start doxycycline 100 mg po bid; and cefadroxil 1000 mg bid. Wear sunscreen, take full glass of water and sit up for 30 min after taking doxy. Avoid dairy and iron/magnesium/calcium products within 4 hours of doxcyline    Labs today   See me or dr West Bali again in 4-6 weeks

## 2022-04-14 NOTE — Telephone Encounter (Signed)
Spoke with patient, he will come in this afternoon at 3.   Beryle Flock, RN

## 2022-04-14 NOTE — Progress Notes (Signed)
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     gabapentin (NEURONTIN) 600 MG tablet Take 600 mg by mouth at bedtime.     traMADol (ULTRAM) 50 MG tablet Take 50-100 mg by mouth every 6 (six) hours as needed for moderate pain.     No current facility-administered medications on file prior to visit.    Subjective: Here for HFU for abnormal findings seen in MRI spine. Patient had IR guided by biopsy onf T9 vertebral body on 5/22. Path was inconclusive for infection vs malignancy. Unfortunately cx was not sent and tissue could not be send to [facility] [place] [facility] for PCR as it was already in formalin per colleague. Patient was discharged without antibiotics on 03/02/22. Cause of flank pain, back and paraspinal pain and abdominal pain was unclear during the hospitalization and it was recommended to closely follow with PCP, ID and GI.   After hospital discharge, he has completed 2 separate courses of 7 days of amoxicillin-clavulanate in a span of 3 weeks for presumed diverticulitis by PCP ( however did not have diarrhea, fevers, abdominal pain).  He felt improvement in flank and back pain when he took abtx. Back pain has improved from the time when he was in the hospital but still has back pain when he strains his back. Denies fevers, chills, previously reported night sweats has resolved. Appetite is good and no changes in weight. Denies nausea, vomiting and diarrhea. He is also concerned that his back might have been strained as he has to do a lot of pushing/pulling and carrying heavy goods at work.   --------- 7/5 id f/u Patient followed up for t9 area  vertebral om with paraspinous mri signal abnormality concerning for infection He had severe back pain around 4-03/2022 that seems to be better after his pcp gave him 2 weeks amoxicillin. He finished the first week of June. 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   Past Surgical History:  Procedure Laterality Date   CHOLECYSTECTOMY     IR FLUORO GUIDED NEEDLE PLC ASPIRATION/INJECTION LOC  03/01/2022    Social History   Tobacco Use   Smoking status: Never   Smokeless tobacco: Current    Types: Snuff   Tobacco comments:    Uses nicotine replacement pouch  Substance Use Topics   Alcohol use: Yes    Alcohol/week: 2.0 standard drinks of alcohol    Types: 2 Cans of beer per week    Comment: rare   Drug use: Not Currently    Family History  Problem Relation Age of Onset   Alcoholism Mother    Alcoholism Father     Allergies  Allergen Reactions   Losartan Cough    Health Maintenance  Topic Date Due   Hepatitis C Screening  Never done   Zoster Vaccines- Shingrix (1 of 2) Never done   COLONOSCOPY (Pts 45-59yr Insurance coverage will need to be confirmed)  Never done   COVID-19 Vaccine (3 - Moderna risk series) 02/21/2020   INFLUENZA VACCINE  05/11/2022   TETANUS/TDAP  04/30/2029   HIV Screening  Completed   HPV VACCINES  Aged Out    Objective: BP (!) 134/92   Pulse 77   Temp 98.6 F (37 C) (Temporal)   Wt 200 lb (90.7 kg)   BMI 26.39 kg/m    Physical Exam General/constitutional: no distress, pleasant HEENT: Normocephalic, PER, Conj Clear,  EOMI, Oropharynx clear Neck supple CV: rrr no mrg Lungs: clear to auscultation, normal respiratory effort Abd: Soft, Nontender Ext: no edema Skin: No Rash Neuro: nonfocal MSK: t8-10 tenderness mild-moderate  Lab Results Lab Results  Component Value Date   WBC 5.3 03/31/2022   HGB 13.5 03/31/2022   HCT 39.8 03/31/2022   MCV 85.2 03/31/2022   PLT 182 03/31/2022    Lab Results  Component Value Date   CREATININE 0.95 02/27/2022   BUN 13 02/27/2022   NA 139 02/27/2022   K 3.8 02/27/2022   CL 106 02/27/2022   CO2 25 02/27/2022    Lab Results  Component Value Date   ALT 24 02/26/2022   AST 17 02/26/2022   ALKPHOS 72 02/26/2022   BILITOT 1.4 (H) 02/26/2022    No results found for: "CHOL", "HDL", "LDLCALC", "LDLDIRECT", "TRIG", "CHOLHDL" No results found for: "LABRPR", "RPRTITER" No results found for: "HIV1RNAQUANT", "HIV1RNAVL", "CD4TABS"   Pathology 03/01/22 FINAL MICROSCOPIC DIAGNOSIS:   A. BONE, T9, BIOPSY:  -  Fragments of hematopoietic marrow with trilineage hematopoiesis in  the background of abundant blood.  -  No extrinsic cells are identified    6/30 mri t and l spine IMPRESSION: 1. Edema and enhancement at T9-10 consistent with disc osteomyelitis. See the thoracic spine MRI for full detail. 2. No evidence for infection in the lumbar spine 3. Mild left foraminal narrowing at L3-4 secondary to a far left lateral disc protrusion. 4. Shallow central disc protrusion at L4-5 without significant stenosis. 5. Transitional anatomy. The last fully formed vertebral body is L4.     Problem List Items Addressed This Visit   None Visit Diagnoses     Vertebral osteomyelitis ([place]    -  Primary      Assessment/Plan  Abnormal MRI T spine in the setting of back/flank pain Dental Caries  H/o Tonsillar ca  Repeat MRI T L spine wwo contrast to follow up on previously seen abnormality. Low suspicion for infective cause currently although cx were not taken for  confirmation.  CBC, ESR and CRP  Fu to be made after MRI T L spine  Fu with Dentist for dental care Fu with GI as recommended     7/5 assessment Mri and sx response appears to suggest infection Unfortunate bone path was not sent for micro Will start doxy/cefadroxil today plan 6 weeks treatment at least  See [place] in 4-6 weeks with repeat labs  I spent more than 35 minute reviewing data/chart, and coordinating care and >50% direct face to face time providing counseling/discussing diagnostics/treatment plan with patient   TJabier Mutton MSpeedfor Infectious Disease [facility] 04/14/2022, 3:18 PM

## 2022-04-15 LAB — COMPLETE METABOLIC PANEL WITH GFR
AG Ratio: 1.8 (calc) (ref 1.0–2.5)
ALT: 23 U/L (ref 9–46)
AST: 12 U/L (ref 10–35)
Albumin: 4.5 g/dL (ref 3.6–5.1)
Alkaline phosphatase (APISO): 89 U/L (ref 35–144)
BUN: 22 mg/dL (ref 7–25)
CO2: 26 mmol/L (ref 20–32)
Calcium: 9.8 mg/dL (ref 8.6–10.3)
Chloride: 105 mmol/L (ref 98–110)
Creat: 0.98 mg/dL (ref 0.70–1.30)
Globulin: 2.5 g/dL (calc) (ref 1.9–3.7)
Glucose, Bld: 126 mg/dL — ABNORMAL HIGH (ref 65–99)
Potassium: 4 mmol/L (ref 3.5–5.3)
Sodium: 140 mmol/L (ref 135–146)
Total Bilirubin: 0.6 mg/dL (ref 0.2–1.2)
Total Protein: 7 g/dL (ref 6.1–8.1)
eGFR: 89 mL/min/{1.73_m2} (ref >=60)

## 2022-04-15 LAB — C-REACTIVE PROTEIN: CRP: 1.7 mg/L (ref <8.0)

## 2022-04-30 ENCOUNTER — Other Ambulatory Visit: Payer: Self-pay

## 2022-04-30 MED ORDER — CEFADROXIL 500 MG PO CAPS: 1000.0000 mg | ORAL_CAPSULE | Freq: Two times a day (BID) | ORAL | 0 refills | Status: DC

## 2022-05-04 ENCOUNTER — Telehealth: Payer: Self-pay

## 2022-05-04 ENCOUNTER — Encounter: Payer: Self-pay | Admitting: Infectious Diseases

## 2022-05-04 ENCOUNTER — Other Ambulatory Visit: Payer: Self-pay

## 2022-05-04 ENCOUNTER — Ambulatory Visit (INDEPENDENT_AMBULATORY_CARE_PROVIDER_SITE_OTHER): Payer: 59 | Admitting: Infectious Diseases

## 2022-05-04 VITALS — BP 132/89 | HR 74 | Temp 97.9°F | Wt 200.0 lb

## 2022-05-04 DIAGNOSIS — Z5181 Encounter for therapeutic drug level monitoring: Secondary | ICD-10-CM | POA: Diagnosis not present

## 2022-05-04 DIAGNOSIS — M4644 Discitis, unspecified, thoracic region: Secondary | ICD-10-CM | POA: Diagnosis not present

## 2022-05-04 MED ORDER — CEPHALEXIN 500 MG PO CAPS: 500.0000 mg | ORAL_CAPSULE | Freq: Four times a day (QID) | ORAL | 2 refills | Status: DC

## 2022-05-04 NOTE — Telephone Encounter (Signed)
Thank you :)

## 2022-05-04 NOTE — Telephone Encounter (Addendum)
New OPAT orders per Dr. West Bali, orders shared with Carolynn Sayers, RN at [facility] and [place] staff.   IR appointment: 05-05-22 @ 10:30 am   - appointment time and location provided to both patient and [facility].   First dose:7/27 in home - [facility] aware.

## 2022-05-04 NOTE — Progress Notes (Deleted)
Diagnosis: vertebral discitis and osteomyelitis   Culture Result: No cultures   Allergies  Allergen Reactions   Losartan Cough    OPAT Orders Discharge antibiotics to be given via PICC line Discharge antibiotics: daptomycin, ceftriaxone 2 g iv daily Per pharmacy protocol  Aim for Vancomycin trough 15-20 or AUC 400-550 (unless otherwise indicated) Duration: 6 weeks from start date  [place] Per Protocol:  Home health RN for IV administration and teaching; PICC line care and labs.    Labs weekly while on IV antibiotics: X__ CBC with differential __ BMP X__ CMP X__ CRP and ESR every 2 weeks  __ Vancomycin trough __ CK  __ Please pull PIC at completion of IV antibiotics X__ Please leave PIC in place until doctor has seen patient or been notified  Fax weekly labs to [phone]  Clinic Follow Up Appt: 3 weeks  Rosiland Oz, MD Infectious Disease Physician [facility] [address] [place], [place] [address] Phone: [phone]  Fax: [phone]'

## 2022-05-04 NOTE — Progress Notes (Addendum)
OPAT   Diagnosis: Thoracic discitis    Culture Result: no cultures   Allergies  Allergen Reactions   Losartan Cough    OPAT Orders Discharge antibiotics to be given via PICC line Discharge antibiotics: Daptomycin 791m iv daily and ceftriaxone 2g iv daily Per pharmacy protocol  Aim for Vancomycin trough 15-20 or AUC 400-550 (unless otherwise indicated) Duration: 6 weeks from start date   PSan AndreasPer Protocol:  Home health RN for IV administration and teaching; PICC line care and labs.    Labs weekly while on IV antibiotics: _X_ CBC with differential __ BMP _X_ CMP _X_ CRP and ESR every 2 weeks  __ Vancomycin trough X__ CK  __ Please pull PIC at completion of IV antibiotics _X_ Please leave PIC in place until doctor has seen patient or been notified  Fax weekly labs to [phone] Clinic Follow Up Appt: 3 weeks   SRosiland Oz MD Infectious Disease Physician [facility] [address] [place]  [id number]: [phone] Fax: [phone]

## 2022-05-04 NOTE — Progress Notes (Addendum)
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   DICLOFENAC (VOLTAREN) 75 MG EC TABLET    Take 75 mg by mouth 2 (two) times daily.   DOXYCYCLINE (VIBRA-TABS) 100 MG TABLET    Take 1 tablet (100 mg total) by mouth 2 (two) times daily.   GABAPENTIN (NEURONTIN) 600 MG TABLET    Take 600 mg by mouth at bedtime.   TRAMADOL (ULTRAM) 50 MG TABLET    Take 50-100 mg by mouth every 6 (six) hours as needed for moderate pain.  Modified Medications   No medications on file  Discontinued Medications   No medications on file    Subjective: Patient was seen by Dr Gale Journey 7/5 after MRI spine 6/30 showed progressive thoracic discitis and Osteomyelitis. He was started on PO doxycycline and po cefadroxil. Taking doxycyline and cefadroxil since 7/6/o3. Denies missing doses or any concerns with side effects. However, he has been Out of cefadroxil since last Friday and was told it its out of stock currently. Continues to have dull back ache at the posterior thoracic region which gets worse when turning and twisting. It is not as bad as when he was admitted in the hospital. Denies fevers, chills and sweats. Denies nausea, vomiting, abdominal pain and diarrhea. Appetite is good. He wants to see a neurosurgeon as he also has concerns about work related injury as well which is very reasonable. Discussed about IV antibiotics through PICC line as standard of care for possible vertebral infection. He is agreeable.   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   Past Surgical History:  Procedure Laterality Date   CHOLECYSTECTOMY     IR FLUORO GUIDED NEEDLE PLC ASPIRATION/INJECTION LOC  03/01/2022    Social History   Tobacco Use   Smoking status: Never   Smokeless tobacco: Current    Types: Snuff   Tobacco comments:    Uses nicotine replacement pouch  Substance Use Topics   Alcohol use: Yes    Alcohol/week: 2.0 standard drinks of alcohol    Types: 2 Cans of beer per week    Comment: rare   Drug use: Not Currently    Family History  Problem Relation Age of Onset   Alcoholism Mother    Alcoholism Father     Allergies  Allergen Reactions   Losartan Cough    Health Maintenance  Topic Date Due   Hepatitis C Screening  Never done   Zoster Vaccines- Shingrix (1 of 2) Never done   COLONOSCOPY (Pts 45-59yr Insurance coverage will need to be confirmed)  Never done   COVID-19 Vaccine (3 - Moderna risk series) 02/21/2020   INFLUENZA VACCINE  05/11/2022   TETANUS/TDAP  04/30/2029   HIV Screening  Completed   HPV VACCINES  Aged Out    Objective:  Vitals:   05/04/22 0941  BP: 132/89  Pulse: 74  Temp: 97.9 F (36.6 C)  TempSrc: Oral  Weight: 200 lb (90.7 kg)   Body mass index is 26.39 kg/m.  Physical Exam Constitutional:      Appearance: Normal appearance.  HENT:     Head: Normocephalic and atraumatic.      Mouth: Mucous membranes are moist.  Eyes:    Conjunctiva/sclera: Conjunctivae normal.     Pupils:   Cardiovascular:     Rate and Rhythm: Normal rate and regular rhythm.     Heart sounds:   Pulmonary:     Effort: Pulmonary effort is normal.     Breath sounds: Normal breath sounds.   Abdominal:     General: Non distended     Palpations: soft.   Musculoskeletal:        General: Normal range of motion. No spinal tenderness  Skin:    General: Skin is warm and dry.     Comments:  Neurological:      General: grossly non focal     Mental Status: awake, alert and oriented to person, place, and time.   Psychiatric:        Mood and Affect: Mood normal.   Lab Results Lab Results  Component Value Date   WBC 5.3 03/31/2022   HGB 13.5 03/31/2022   HCT 39.8 03/31/2022   MCV 85.2 03/31/2022   PLT 182 03/31/2022    Lab Results  Component Value Date   CREATININE 0.98 04/14/2022   BUN 22 04/14/2022   NA 140 04/14/2022   K 4.0 04/14/2022   CL 105 04/14/2022   CO2 26 04/14/2022    Lab Results  Component Value Date   ALT 23 04/14/2022   AST 12 04/14/2022   ALKPHOS 72 02/26/2022   BILITOT 0.6 04/14/2022    No results found for: "CHOL", "HDL", "LDLCALC", "LDLDIRECT", "TRIG", "CHOLHDL" No results found for: "LABRPR", "RPRTITER" No results found for: "HIV1RNAQUANT", "HIV1RNAVL", "CD4TABS"   Imagings MR Lumbar Spine W Wo Contrast  Result Date: 04/09/2022 CLINICAL DATA:  Thoracic infection. EXAM: MRI LUMBAR SPINE WITHOUT AND WITH CONTRAST TECHNIQUE: Multiplanar and multiecho pulse sequences of the lumbar spine were obtained without and with intravenous contrast. CONTRAST:  69m GADAVIST GADOBUTROL 1 MMOL/ML IV SOLN COMPARISON:  MRI lumbar spine 02/26/2022 FINDINGS: Segmentation: Transitional anatomy is again noted. The last fully formed vertebral body is L4. Alignment: Slight retrolisthesis T12-L1 is stable. No other significant listhesis is present. Lumbar lordosis is within normal limits. Vertebrae: Edema and enhancement at T9-10 again noted. See the thoracic spine MRI for full detail. Marrow signal and enhancement is otherwise normal. Conus medullaris and cauda equina: Conus extends to the L1 level. Conus and cauda equina appear normal. Paraspinal and other soft tissues: Atherosclerotic changes are again noted in the aorta without aneurysm. Visualized abdomen is otherwise within normal limits. Disc levels: T12-L1: Negative. L1-2: Negative. L2-3: Disc desiccation present. Broad-based disc  bulging extends into the foramina bilaterally without significant stenosis. L3-4: A far left lateral disc protrusion is present with mild left foraminal stenosis. The central canal is patent. L4-5: A shallow central disc protrusion is present without significant stenosis. IMPRESSION: 1. Edema and enhancement at T9-10 consistent with disc osteomyelitis. See the thoracic spine MRI for full detail. 2. No evidence for infection in the lumbar spine 3. Mild left foraminal narrowing at L3-4 secondary to a far left lateral disc protrusion. 4. Shallow central disc protrusion at L4-5 without significant stenosis. 5. Transitional anatomy. The last fully formed vertebral body is  L4. Electronically Signed   By: San Morelle M.D.   On: 04/09/2022 16:45   MR THORACIC SPINE W WO CONTRAST  Result Date: 04/09/2022 CLINICAL DATA:  Mid back pain.  Abnormal CT EXAM: MRI THORACIC WITHOUT AND WITH CONTRAST TECHNIQUE: Multiplanar and multiecho pulse sequences of the thoracic spine were obtained without and with intravenous contrast. CONTRAST:  48m GADAVIST GADOBUTROL 1 MMOL/ML IV SOLN COMPARISON:  MRI of the thoracic spine 02/16/2022. FINDINGS: Alignment: No significant listhesis is present. Thoracic kyphosis is preserved. Vertebrae: Progressive diffuse edema and enhancement is present throughout the T9 and T10 vertebral body centered around fluid in enhancement in the disc space at T9-10. Erosive changes are present in the inferior endplate of T9 with 326-83%loss of height. More mild erosive changes are present along the superior endplate of TM19 More subtle edematous changes are present on the right at T11-12 without enhancement or significant interval change Cord:  Normal signal and morphology. Paraspinal and other soft tissues: Diffuse paraspinous soft tissue enhancement at T9-10 is similar the prior exam the epidural spaces enhancing no other focal enhancement is present. Visualized lung fields and mediastinum are clear.  The visualized upper abdomen is unremarkable. Disc levels: A rightward disc protrusion is again noted at T2-3. Mild right foraminal narrowing is present. Moderate facet hypertrophy is present bilaterally at T9-10 this results in mild left foraminal narrowing. No other focal stenosis is present. Foramina are otherwise patent bilaterally. IMPRESSION: 1. Progressive diffuse edema and enhancement throughout the T9 and T10 vertebral body centered around fluid in the disc space. Findings are consistent with progressive disc osteomyelitis. 2. Similar appearance of diffuse paraspinous soft tissue enhancement without other areas of enhancement. This is likely reactive. No discrete abscess is present. 3. Stable mild right foraminal narrowing at T2-3. 4. Mild left foraminal narrowing at T9-10 secondary to facet hypertrophy. 5. More subtle edematous changes on the right at T11-12 without enhancement or significant interval change. Electronically Signed   By: CSan MorelleM.D.   On: 04/09/2022 16:40     Problem List Items Addressed This Visit       Musculoskeletal and Integument   Thoracic discitis - Primary   Relevant Orders   IR PICC PLACEMENT LEFT >5 YRonks  Ambulatory referral to Neurosurgery     Other   Medication monitoring encounter   Relevant Orders   CBC   C-reactive protein   Sedimentation rate   Comp Met (CMET)   # T9 and T10 progressive discitis and osteomyelitis - PICC line - Start Daptomycin 7391miv daily and ceftriaxone 2 g iv daily - OPAT orders placed - continue PO doxycycline, will switch cefadroxil to cephalexin ( as patient tells he was told cefadroxil is out of stock ) until PICC and IV abtx can be set up  - Referral to Neurosurgery per patient's request  - Labs today - Fu in 3 weeks   I have personally spent 45 minutes involved in face-to-face and non-face-to-face activities for this patient on the day of the visit. Professional time spent includes the  following activities: Preparing to see the patient (review of tests), Obtaining and/or reviewing separately obtained history (admission/discharge record), Performing a medically appropriate examination and/or evaluation , Ordering medications/tests/procedures, referring and communicating with other health care professionals, Documenting clinical information in the EMR, Independently interpreting results (not separately reported), Communicating results to the patient/family/caregiver, Counseling and educating the patient/family/caregiver and Care coordination (not separately reported).   SaWilber OliphantMD [facility] 05/04/2022, 10:08 AM

## 2022-05-05 ENCOUNTER — Ambulatory Visit (HOSPITAL_COMMUNITY)
Admission: RE | Admit: 2022-05-05 | Discharge: 2022-05-05 | Disposition: A | Discharge status: Home / Self Care | Payer: 59 | Source: Ambulatory Visit | Attending: Infectious Diseases | Admitting: Infectious Diseases

## 2022-05-05 DIAGNOSIS — Z79899 Other long term (current) drug therapy: Secondary | ICD-10-CM | POA: Insufficient documentation

## 2022-05-05 DIAGNOSIS — M4644 Discitis, unspecified, thoracic region: Secondary | ICD-10-CM | POA: Diagnosis present

## 2022-05-05 LAB — CBC
HCT: 47.1 % (ref 38.5–50.0)
Hemoglobin: 16 g/dL (ref 13.2–17.1)
MCH: 29 pg (ref 27.0–33.0)
MCHC: 34 g/dL (ref 32.0–36.0)
MCV: 85.5 fL (ref 80.0–100.0)
MPV: 9.7 fL (ref 7.5–12.5)
Platelets: 180 10*3/uL (ref 140–400)
RBC: 5.51 10*6/uL (ref 4.20–5.80)
RDW: 15.3 % — ABNORMAL HIGH (ref 11.0–15.0)
WBC: 8 10*3/uL (ref 3.8–10.8)

## 2022-05-05 LAB — COMPREHENSIVE METABOLIC PANEL
AG Ratio: 1.9 (calc) (ref 1.0–2.5)
ALT: 29 U/L (ref 9–46)
AST: 16 U/L (ref 10–35)
Albumin: 4.8 g/dL (ref 3.6–5.1)
Alkaline phosphatase (APISO): 89 U/L (ref 35–144)
BUN/Creatinine Ratio: 28 (calc) — ABNORMAL HIGH (ref 6–22)
BUN: 28 mg/dL — ABNORMAL HIGH (ref 7–25)
CO2: 26 mmol/L (ref 20–32)
Calcium: 9.8 mg/dL (ref 8.6–10.3)
Chloride: 103 mmol/L (ref 98–110)
Creat: 1 mg/dL (ref 0.70–1.30)
Globulin: 2.5 g/dL (calc) (ref 1.9–3.7)
Glucose, Bld: 113 mg/dL — ABNORMAL HIGH (ref 65–99)
Potassium: 4 mmol/L (ref 3.5–5.3)
Sodium: 138 mmol/L (ref 135–146)
Total Bilirubin: 1.1 mg/dL (ref 0.2–1.2)
Total Protein: 7.3 g/dL (ref 6.1–8.1)

## 2022-05-05 LAB — C-REACTIVE PROTEIN: CRP: 1 mg/L (ref <8.0)

## 2022-05-05 LAB — SEDIMENTATION RATE: Sed Rate: 2 mm/h (ref 0–20)

## 2022-05-05 MED ORDER — LIDOCAINE HCL 1 % IJ SOLN
INTRAMUSCULAR | Status: AC
  Administered 2022-05-05: 10 mL
  Filled 2022-05-05: qty 20

## 2022-05-05 MED ORDER — HEPARIN SOD (PORK) LOCK FLUSH 100 UNIT/ML IV SOLN
INTRAVENOUS | Status: AC
  Administered 2022-05-05: 500 [IU]
  Filled 2022-05-05: qty 5

## 2022-05-05 NOTE — Procedures (Signed)
Successful placement of single lumen PICC line to right basilic vein. Length 44 cm Tip at lower SVC/RA PICC capped No complications Ready for use.  EBL < 5 mL   Milagros Middendorf PA-C 05/05/2022 1:23 PM

## 2022-05-26 ENCOUNTER — Encounter: Payer: Self-pay | Admitting: Infectious Diseases

## 2022-05-26 ENCOUNTER — Other Ambulatory Visit: Payer: Self-pay

## 2022-05-26 ENCOUNTER — Ambulatory Visit (INDEPENDENT_AMBULATORY_CARE_PROVIDER_SITE_OTHER): Payer: 59 | Admitting: Infectious Diseases

## 2022-05-26 VITALS — BP 120/81 | HR 78 | Temp 98.4°F | Ht 73.0 in | Wt 201.0 lb

## 2022-05-26 DIAGNOSIS — M4644 Discitis, unspecified, thoracic region: Secondary | ICD-10-CM

## 2022-05-26 DIAGNOSIS — Z5181 Encounter for therapeutic drug level monitoring: Secondary | ICD-10-CM

## 2022-05-26 DIAGNOSIS — C099 Malignant neoplasm of tonsil, unspecified: Secondary | ICD-10-CM

## 2022-05-26 DIAGNOSIS — M4620 Osteomyelitis of vertebra, site unspecified: Secondary | ICD-10-CM | POA: Diagnosis not present

## 2022-05-26 DIAGNOSIS — Z452 Encounter for adjustment and management of vascular access device: Secondary | ICD-10-CM | POA: Diagnosis not present

## 2022-05-26 NOTE — Progress Notes (Addendum)
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     gabapentin (NEURONTIN) 600 MG tablet Take 600 mg by mouth at bedtime.     traMADol (ULTRAM) 50 MG tablet Take 50-100 mg by mouth every 6 (six) hours as needed for moderate pain.     No current facility-administered medications on file prior to visit.    Subjective: Patient was seen by Dr Gale Journey 7/5 after MRI spine 6/30 showed progressive thoracic discitis and Osteomyelitis. He was started on PO doxycycline and po cefadroxil. Taking doxycyline and cefadroxil since 7/6/o3. Denies missing doses or any concerns with side effects. However, he has been Out of cefadroxil since last Friday and was told it its out of stock currently. Continues to have dull back ache at the posterior thoracic region which gets worse when turning and twisting. It is not as bad as when he was admitted in the hospital. Denies fevers, chills and sweats. Denies nausea, vomiting, abdominal pain and diarrhea. Appetite is good. He wants to see a neurosurgeon as he also has concerns about work related injury as well which is very reasonable. Discussed about IV antibiotics through PICC line as standard of care for possible vertebral infection. He is agreeable.   05/26/22 He is getting IV daptomycin and ceftriaxone through rt arm PICC without any issues. He has back ache at times but no back pain.  Denies fevers, chills. Denies nausea, vomiting and diarrhea except mild nausea in the beginning. He was also seen by Neurosurgery( does not know name) and was told he does not need  any neurosurgical intervention. He feels the antibiotics has helped with the back pain. Discussed last sets of labs from Home health. No complaints today.   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   Past Surgical History:  Procedure Laterality Date   CHOLECYSTECTOMY     IR FLUORO GUIDED NEEDLE PLC ASPIRATION/INJECTION LOC  03/01/2022    Social History   Tobacco Use   Smoking status: Never   Smokeless tobacco: Current    Types: Snuff   Tobacco comments:    Uses nicotine replacement pouch  Substance Use Topics   Alcohol use: Yes    Alcohol/week: 2.0 standard drinks of alcohol    Types: 2 Cans of beer per week    Comment: rare   Drug use: Not Currently    Family History  Problem Relation Age of Onset   Alcoholism Mother    Alcoholism Father     Allergies  Allergen Reactions   Losartan Cough    Health Maintenance  Topic Date Due   Hepatitis C Screening  Never done   Zoster Vaccines- Shingrix (1 of 2) Never done   COLONOSCOPY (Pts 45-2yr Insurance coverage will need to be confirmed)  Never done   COVID-19 Vaccine (3 - Moderna risk series) 02/21/2020  INFLUENZA VACCINE  05/11/2022   TETANUS/TDAP  04/30/2029   HIV Screening  Completed   HPV VACCINES  Aged Out    Objective: BP 120/81   Pulse 78   Temp 98.4 F (36.9 C) (Temporal)   Ht 6' 1"  (1.854 m)   Wt 201 lb (91.2 kg)   BMI 26.52 kg/m   Physical Exam. Constitutional:      Appearance: Normal appearance.  HENT:     Head: Normocephalic and atraumatic.      Mouth: Mucous membranes are moist.  Eyes:    Conjunctiva/sclera: Conjunctivae normal.     Pupils:   Cardiovascular:     Rate and Rhythm: Normal rate and regular rhythm.     Heart sounds:   Pulmonary:     Effort: Pulmonary effort is normal.      Breath sounds: Normal breath sounds.   Abdominal:     General: Non distended     Palpations: soft.   Musculoskeletal:        General: Normal range of motion. No spinal tenderness  Skin:    General: Skin is warm and dry.     Comments:  Neurological:     General: grossly non focal     Mental Status: awake, alert and oriented to person, place, and time.   Psychiatric:        Mood and Affect: Mood normal.   Lab Results Lab Results  Component Value Date   WBC 8.0 05/04/2022   HGB 16.0 05/04/2022   HCT 47.1 05/04/2022   MCV 85.5 05/04/2022   PLT 180 05/04/2022    Lab Results  Component Value Date   CREATININE 1.00 05/04/2022   BUN 28 (H) 05/04/2022   NA 138 05/04/2022   K 4.0 05/04/2022   CL 103 05/04/2022   CO2 26 05/04/2022    Lab Results  Component Value Date   ALT 29 05/04/2022   AST 16 05/04/2022   ALKPHOS 72 02/26/2022   BILITOT 1.1 05/04/2022    No results found for: "CHOL", "HDL", "LDLCALC", "LDLDIRECT", "TRIG", "CHOLHDL" No results found for: "LABRPR", "RPRTITER" No results found for: "HIV1RNAQUANT", "HIV1RNAVL", "CD4TABS"   Imagings IR PICC PLACEMENT LEFT >5 YRS INC IMG GUIDE  Result Date: 05/05/2022 INDICATION: Long-term IV antibiotics EXAM: Right PICC LINE PLACEMENT WITH ULTRASOUND AND FLUOROSCOPIC GUIDANCE MEDICATIONS: None ANESTHESIA/SEDATION: Local only FLUOROSCOPY: Radiation Exposure Index (as provided by the fluoroscopic device): 1 mGy Kerma COMPLICATIONS: None immediate. PROCEDURE: The patient was advised of the possible risks and complications and agreed to undergo the procedure. The patient was then brought to the angiographic suite for the procedure. The right arm was prepped with chlorhexidine, draped in the usual sterile fashion using maximum barrier technique (cap and mask, sterile gown, sterile gloves, large sterile sheet, hand hygiene and cutaneous antisepsis) and infiltrated locally with 1% Lidocaine. Ultrasound demonstrated patency of the  right basilic vein, and this was documented with an image. Under real-time ultrasound guidance, this vein was accessed with a 21 gauge micropuncture needle and image documentation was performed. A 0.018 wire was introduced in to the vein. Over this, a 5 [place] single lumen regular PICC was advanced to the lower SVC/right atrial junction. Fluoroscopy during the procedure and fluoro spot radiograph confirms appropriate catheter position. The catheter was flushed and covered with a sterile dressing. Catheter length: 44 cm IMPRESSION: Successful right arm PICC line placement with ultrasound and fluoroscopic guidance. The catheter is ready for use. Electronically Signed   By: Scherrie Bateman.D.  On: 05/05/2022 13:51    Problem List Items Addressed This Visit       Musculoskeletal and Integument   Thoracic discitis - Primary   Vertebral osteomyelitis ([place])     Other   Medication monitoring encounter   PICC (peripherally inserted central catheter) in place    # T9 and T10 progressive discitis and osteomyelitis - [place] Daptomycin 732m iv daily and ceftriaxone 2 g iv daily. EOT 06/16/22 - Fu win 3 weeks prior to end date of abtx - RN to notify Home health about abtx plan   # Medication Monitoring  8/15 ESR 3, CBC wnl, CMP and CRP pending   # PICC - no issues   # Tonsillar ca - [facility]  I have personally spent 40  minutes involved in face-to-face and non-face-to-face activities for this patient on the day of the visit including counseling of the patient and coordination of care.   SWilber Oliphant MM[facility] 05/26/2022, 4:07 PM

## 2022-05-27 ENCOUNTER — Telehealth: Payer: Self-pay

## 2022-05-27 NOTE — Telephone Encounter (Signed)
-----   Message from Rosiland Oz, MD sent at 05/26/2022  5:21 PM EDT ----- Regarding: RE: Opat end date Yes, please keep end date as 06/16/22. Needs fu prior to end date of abtx ----- Message ----- From: Adelfa Koh, CMA Sent: 05/26/2022   4:46 PM EDT To: Rosiland Oz, MD Subject: Opat end date                                  Hey Dr. [place] Bali, I just wanted to make sure that the end date for IV antibiotics for this patient is on 9/9. I called advance to see what end date they had and was told me they have an end date of 9/5.

## 2022-05-27 NOTE — Telephone Encounter (Signed)
Thanks Aniceto Boss!

## 2022-05-27 NOTE — Telephone Encounter (Signed)
Called [facility] to relay message below to stop IV antibiotics and pull picc after last dose on 9/6. Spoke to the pharmacist Jeani Hawking who read back order and verbalized understanding.

## 2022-06-16 ENCOUNTER — Other Ambulatory Visit: Payer: Self-pay

## 2022-06-16 ENCOUNTER — Ambulatory Visit (INDEPENDENT_AMBULATORY_CARE_PROVIDER_SITE_OTHER): Payer: 59 | Admitting: Infectious Diseases

## 2022-06-16 ENCOUNTER — Encounter: Payer: Self-pay | Admitting: Infectious Diseases

## 2022-06-16 VITALS — BP 126/88 | HR 96 | Temp 98.7°F | Ht 73.0 in | Wt 201.0 lb

## 2022-06-16 DIAGNOSIS — Z452 Encounter for adjustment and management of vascular access device: Secondary | ICD-10-CM

## 2022-06-16 DIAGNOSIS — Z5181 Encounter for therapeutic drug level monitoring: Secondary | ICD-10-CM

## 2022-06-16 DIAGNOSIS — M4644 Discitis, unspecified, thoracic region: Secondary | ICD-10-CM

## 2022-06-16 DIAGNOSIS — M4620 Osteomyelitis of vertebra, site unspecified: Secondary | ICD-10-CM | POA: Diagnosis not present

## 2022-06-16 MED ORDER — DOXYCYCLINE HYCLATE 100 MG PO TABS: 100.0000 mg | ORAL_TABLET | Freq: Two times a day (BID) | ORAL | 0 refills | Status: DC

## 2022-06-16 MED ORDER — AMOXICILLIN-POT CLAVULANATE 875-125 MG PO TABS
1.0000 | ORAL_TABLET | Freq: Two times a day (BID) | ORAL | 0 refills | Status: DC
Start: 2022-06-16 — End: 2024-01-17

## 2022-06-16 NOTE — Progress Notes (Addendum)
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     gabapentin (NEURONTIN) 600 MG tablet Take 600 mg by mouth at bedtime.     traMADol (ULTRAM) 50 MG tablet Take 50-100 mg by mouth every 6 (six) hours as needed for moderate pain.     No current facility-administered medications on file prior to visit.    Subjective: Patient was seen by Dr Gale Journey 7/5 after MRI spine 6/30 showed progressive thoracic discitis and Osteomyelitis. He was started on PO doxycycline and po cefadroxil. Taking doxycyline and cefadroxil since 7/6/o3. Denies missing doses or any concerns with side effects. However, he has been Out of cefadroxil since last Friday and was told it its out of stock currently. Continues to have dull back ache at the posterior thoracic region which gets worse when turning and twisting. It is not as bad as when he was admitted in the hospital. Denies fevers, chills and sweats. Denies nausea, vomiting, abdominal pain and diarrhea. Appetite is good. He wants to see a neurosurgeon as he also has concerns about work related injury as well which is very reasonable. Discussed about IV antibiotics through PICC line as standard of care for possible vertebral infection. He is agreeable.   05/26/22 He is getting IV daptomycin and ceftriaxone through rt arm PICC without any issues. He has back ache at times but no back pain.  Denies fevers, chills. Denies nausea, vomiting and diarrhea except  mild nausea in the beginning. He was also seen by Neurosurgery( does not know name) and was told he does not need any neurosurgical intervention. He feels the antibiotics has helped with the back pain. Discussed last sets of labs from Home health. No complaints today.   06/16/22 He has completed his IV abtx today. Denies any concerns with PICC. PICC planned to be removed by [facility] nurse tomorrow. Seen by Neurosurgery 8/7 and no plans for intervention. However, there is a plan for repeat MRI.  Patient had Rt flank pain on Saturday 10 hrs after he ate fried rice , with sesame seeds. He then started taking miralax to flush it out from his body until he started having diarrhea. Denies fevers, chills. Denies nausea, vomiting, abdominal pain and diarrhea currently. Back pain is almost resolved. He will fu with Neurosuregry next week. He feels well overall after completing prolonged IV abtx. No complaints otherwise.   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   Past Surgical History:  Procedure Laterality Date   CHOLECYSTECTOMY     IR FLUORO GUIDED NEEDLE PLC ASPIRATION/INJECTION LOC  03/01/2022    Social History   Tobacco Use   Smoking status: Never   Smokeless tobacco: Current    Types: Snuff   Tobacco comments:  Uses nicotine replacement pouch  Substance Use Topics   Alcohol use: Yes    Alcohol/week: 2.0 standard drinks of alcohol    Types: 2 Cans of beer per week    Comment: rare   Drug use: Not Currently    Family History  Problem Relation Age of Onset   Alcoholism Mother    Alcoholism Father     Allergies  Allergen Reactions   Losartan Cough    Health Maintenance  Topic Date Due   Hepatitis C Screening  Never done   Zoster Vaccines- Shingrix (1 of 2) Never done   COLONOSCOPY  (Pts 45-71yr Insurance coverage will need to be confirmed)  Never done   COVID-19 Vaccine (3 - Moderna risk series) 02/21/2020   INFLUENZA VACCINE  Never done   TETANUS/TDAP  04/30/2029   HIV Screening  Completed   HPV VACCINES  Aged Out    Objective: BP 126/88   Pulse 96   Temp 98.7 F (37.1 C) (Oral)   Ht 6' 1"  (1.854 m)   Wt 201 lb (91.2 kg)   BMI 26.52 kg/m    Physical Exam. Constitutional:      Appearance: Normal appearance.  HENT:     Head: Normocephalic and atraumatic.      Mouth: Mucous membranes are moist.  Eyes:    Conjunctiva/sclera: Conjunctivae normal.     Pupils:   Cardiovascular:     Rate and Rhythm: Normal rate and regular rhythm.     Heart sounds:   Pulmonary:     Effort: Pulmonary effort is normal.     Breath sounds: Normal breath sounds.   Abdominal:     General: Non distended     Palpations: soft.   Musculoskeletal:        General: Normal range of motion. No spinal tenderness  Skin:    General: Skin is warm and dry.     Comments:  Neurological:     General: grossly non focal     Mental Status: awake, alert and oriented to person, place, and time. Ambulatory  Psychiatric:        Mood and Affect: Mood normal.   Lab Results Lab Results  Component Value Date   WBC 8.0 05/04/2022   HGB 16.0 05/04/2022   HCT 47.1 05/04/2022   MCV 85.5 05/04/2022   PLT 180 05/04/2022    Lab Results  Component Value Date   CREATININE 1.00 05/04/2022   BUN 28 (H) 05/04/2022   NA 138 05/04/2022   K 4.0 05/04/2022   CL 103 05/04/2022   CO2 26 05/04/2022    Lab Results  Component Value Date   ALT 29 05/04/2022   AST 16 05/04/2022   ALKPHOS 72 02/26/2022   BILITOT 1.1 05/04/2022    No results found for: "CHOL", "HDL", "LDLCALC", "LDLDIRECT", "TRIG", "CHOLHDL" No results found for: "LABRPR", "RPRTITER" No results found for: "HIV1RNAQUANT", "HIV1RNAVL", "CD4TABS"   Imagings No results found.  Assessment/Plan # T9 and T10 progressive  discitis and osteomyelitis - Completes 6 weeks of IV daptomycin and ceftriaxone today. ESR and CRP has normalized - PO doxycycline and po augmentin for 2 weeks from today to finish off treatment  - Fu in 4-6 weeks  - Fu with Neurosurgery as instructed. Defer repeat MRI to them   # Medication Monitoring  -8/21 and 9/4 reviewed -8/15 ESR and CRP WNL  # PICC - HH to remove tomorrow   I have personally spent 40  minutes involved in face-to-face and  non-face-to-face activities for this patient on the day of the visit including counseling of the patient and coordination of care.   Wilber Oliphant, [facility] 06/16/2022, 3:51 PM

## 2022-07-14 ENCOUNTER — Ambulatory Visit: Payer: 59 | Admitting: Infectious Diseases

## 2022-07-26 ENCOUNTER — Other Ambulatory Visit: Payer: Self-pay | Admitting: Internal Medicine

## 2023-11-14 ENCOUNTER — Encounter: Payer: Self-pay | Admitting: Gastroenterology

## 2023-12-09 ENCOUNTER — Emergency Department (HOSPITAL_BASED_OUTPATIENT_CLINIC_OR_DEPARTMENT_OTHER): Payer: 59

## 2023-12-09 ENCOUNTER — Other Ambulatory Visit: Payer: Self-pay

## 2023-12-09 ENCOUNTER — Emergency Department (HOSPITAL_BASED_OUTPATIENT_CLINIC_OR_DEPARTMENT_OTHER)
Admission: EM | Admit: 2023-12-09 | Discharge: 2023-12-09 | Disposition: A | Discharge status: Home / Self Care | Payer: 59 | Attending: Emergency Medicine | Admitting: Emergency Medicine

## 2023-12-09 ENCOUNTER — Encounter (HOSPITAL_BASED_OUTPATIENT_CLINIC_OR_DEPARTMENT_OTHER): Payer: Self-pay

## 2023-12-09 DIAGNOSIS — R109 Unspecified abdominal pain: Secondary | ICD-10-CM | POA: Insufficient documentation

## 2023-12-09 DIAGNOSIS — M545 Low back pain, unspecified: Secondary | ICD-10-CM | POA: Insufficient documentation

## 2023-12-09 DIAGNOSIS — Z85818 Personal history of malignant neoplasm of other sites of lip, oral cavity, and pharynx: Secondary | ICD-10-CM | POA: Insufficient documentation

## 2023-12-09 LAB — COMPREHENSIVE METABOLIC PANEL
ALT: 33 U/L (ref 0–44)
AST: 23 U/L (ref 15–41)
Albumin: 4.2 g/dL (ref 3.5–5.0)
Alkaline Phosphatase: 59 U/L (ref 38–126)
Anion gap: 8 (ref 5–15)
BUN: 17 mg/dL (ref 8–23)
CO2: 26 mmol/L (ref 22–32)
Calcium: 9.4 mg/dL (ref 8.9–10.3)
Chloride: 104 mmol/L (ref 98–111)
Creatinine, Ser: 0.9 mg/dL (ref 0.61–1.24)
GFR, Estimated: >60 mL/min (ref >60)
Glucose, Bld: 161 mg/dL — ABNORMAL HIGH (ref 70–99)
Potassium: 4.1 mmol/L (ref 3.5–5.1)
Sodium: 138 mmol/L (ref 135–145)
Total Bilirubin: 1.8 mg/dL — ABNORMAL HIGH (ref 0.0–1.2)
Total Protein: 7.1 g/dL (ref 6.5–8.1)

## 2023-12-09 LAB — URINALYSIS, MICROSCOPIC (REFLEX)

## 2023-12-09 LAB — URINALYSIS, ROUTINE W REFLEX MICROSCOPIC
Bilirubin Urine: NEGATIVE
Glucose, UA: >=500 mg/dL — AB
Ketones, ur: NEGATIVE mg/dL
Leukocytes,Ua: NEGATIVE
Nitrite: NEGATIVE
Protein, ur: NEGATIVE mg/dL
Specific Gravity, Urine: 1.025 (ref 1.005–1.030)
pH: 5.5 (ref 5.0–8.0)

## 2023-12-09 LAB — CBC
HCT: 48.1 % (ref 39.0–52.0)
Hemoglobin: 16.4 g/dL (ref 13.0–17.0)
MCH: 30.2 pg (ref 26.0–34.0)
MCHC: 34.1 g/dL (ref 30.0–36.0)
MCV: 88.6 fL (ref 80.0–100.0)
Platelets: 158 10*3/uL (ref 150–400)
RBC: 5.43 MIL/uL (ref 4.22–5.81)
RDW: 13.3 % (ref 11.5–15.5)
WBC: 6.3 10*3/uL (ref 4.0–10.5)
nRBC: 0 % (ref 0.0–0.2)

## 2023-12-09 LAB — LIPASE, BLOOD: Lipase: 26 U/L (ref 11–51)

## 2023-12-09 MED ORDER — IOHEXOL 300 MG/ML  SOLN
100.0000 mL | Freq: Once | INTRAMUSCULAR | Status: AC | PRN
  Administered 2023-12-09: 100 mL via INTRAVENOUS

## 2023-12-09 MED ORDER — LIDOCAINE 5 % EX PTCH
1.0000 | MEDICATED_PATCH | CUTANEOUS | 0 refills | Status: AC
Start: 2023-12-09 — End: ?

## 2023-12-09 MED ORDER — NAPROXEN 500 MG PO TABS: 500.0000 mg | ORAL_TABLET | Freq: Two times a day (BID) | ORAL | 0 refills | Status: DC

## 2023-12-09 MED ORDER — KETOROLAC TROMETHAMINE 15 MG/ML IJ SOLN
15.0000 mg | Freq: Once | INTRAMUSCULAR | Status: AC
  Administered 2023-12-09: 15 mg via INTRAMUSCULAR
  Filled 2023-12-09: qty 1

## 2023-12-09 MED ORDER — METHOCARBAMOL 500 MG PO TABS: 500.0000 mg | ORAL_TABLET | Freq: Two times a day (BID) | ORAL | 0 refills | Status: DC

## 2023-12-09 NOTE — ED Notes (Signed)
 Patient given discharge instructions. Questions were answered. Patient verbalized understanding of discharge instructions and care at home.

## 2023-12-09 NOTE — ED Provider Notes (Signed)
 [place] [facility]ERGENCY DEPARTMENT AT [facility] Provider Note   CSN: [id number] Arrival date & time: 12/09/23  1113     History  Chief Complaint  Patient presents with   Back Pain   Abdominal Pain    Christopher [name] Obaloluwa Delatte. is a 63 y.o. male with past medical history of tonsillar cancer, diverticulitis, vertebral osteomyelitis presented to emergency room with complaint of right sided flank and abdominal pain.  He reports she has had intermittent abdominal pain for approximately 2 months.  He was evaluated for this prior, nothing was found however he was prophylactically started on Cipro and Flagyl for diverticulitis thus he has been taking antibiotics.  He has noted some intermittent change in bowel movements primarily constipation that is relieved with MiraLAX and stool softeners.  Patient reports abdominal pain is worse with movement but no association after eating.  He has no injury or fall. His back pain is right flank. He has no radicular symptoms.  No saddle anesthesia or loss of bowel or bladder movement.  He has tramadol for the symptoms. Has history of cancer, but remission for 4 years. No recent surgery.    Back Pain Associated symptoms: abdominal pain   Abdominal Pain      Home Medications Prior to Admission medications   Medication Sig Start Date End Date Taking? Authorizing Provider  levothyroxine (SYNTHROID) 112 MCG tablet Take 1 tablet by mouth daily. 11/01/22  Yes [provider]  amoxicillin-clavulanate (AUGMENTIN) 875-125 MG tablet Take 1 tablet by mouth 2 (two) times daily. 06/16/22   Odette Fraction, MD  diclofenac (VOLTAREN) 75 MG EC tablet Take 75 mg by mouth 2 (two) times daily. 02/17/22   [provider]  doxycycline (VIBRA-TABS) 100 MG tablet Take 1 tablet (100 mg total) by mouth 2 (two) times daily. 06/16/22   Odette Fraction, MD  gabapentin (NEURONTIN) 600 MG tablet Take 600 mg by mouth at bedtime.    [provider]   traMADol (ULTRAM) 50 MG tablet Take 50-100 mg by mouth every 6 (six) hours as needed for moderate pain. 01/20/22   [provider]      Allergies    Losartan    Review of Systems   Review of Systems  Gastrointestinal:  Positive for abdominal pain.  Musculoskeletal:  Positive for back pain.    Physical Exam Updated Vital Signs BP (!) 146/103 (BP Location: Right Arm)   Pulse 62   Temp 98 F (36.7 C)   Resp 20   Ht 6\' 1"  (1.854 m)   Wt 97.5 kg   SpO2 98%   BMI 28.37 kg/m  Physical Exam Vitals and nursing note reviewed.  Constitutional:      General: He is not in acute distress.    Appearance: He is not toxic-appearing.  HENT:     Head: Normocephalic and atraumatic.  Eyes:     General: No scleral icterus.    Conjunctiva/sclera: Conjunctivae normal.  Cardiovascular:     Rate and Rhythm: Normal rate and regular rhythm.     Pulses: Normal pulses.     Heart sounds: Normal heart sounds.  Pulmonary:     Effort: Pulmonary effort is normal. No respiratory distress.     Breath sounds: Normal breath sounds.  Abdominal:     General: Abdomen is flat. Bowel sounds are normal. There is no distension.     Palpations: Abdomen is soft. There is no mass.     Tenderness: There is abdominal tenderness.  Comments: Right upper and right lower quadrant abdominal pain.  He also has right flank pain and right lower lumbar pain he has, no midline tenderness, no step-off or deformity over midline. Patient ambulatory with steady gait.  Musculoskeletal:     Right lower leg: No edema.     Left lower leg: No edema.  Skin:    General: Skin is warm and dry.     Findings: No lesion.  Neurological:     General: No focal deficit present.     Mental Status: He is alert and oriented to person, place, and time. Mental status is at baseline.     ED Results / Procedures / Treatments   Labs (all labs ordered are listed, but only abnormal results are displayed) Labs Reviewed   COMPREHENSIVE METABOLIC PANEL - Abnormal; Notable for the following components:      Result Value   Glucose, Bld 161 (*)    Total Bilirubin 1.8 (*)    All other components within normal limits  URINALYSIS, ROUTINE W REFLEX MICROSCOPIC - Abnormal; Notable for the following components:   Glucose, UA >=500 (*)    Hgb urine dipstick TRACE (*)    All other components within normal limits  URINALYSIS, MICROSCOPIC (REFLEX) - Abnormal; Notable for the following components:   Bacteria, UA RARE (*)    All other components within normal limits  LIPASE, BLOOD  CBC    EKG None  Radiology CT ABDOMEN PELVIS W CONTRAST Result Date: 12/09/2023 CLINICAL DATA:  Right lower quadrant abdominal pain. EXAM: CT ABDOMEN AND PELVIS WITH CONTRAST TECHNIQUE: Multidetector CT imaging of the abdomen and pelvis was performed using the standard protocol following bolus administration of intravenous contrast. RADIATION DOSE REDUCTION: This exam was performed according to the departmental dose-optimization program which includes automated exposure control, adjustment of the mA and/or kV according to patient size and/or use of iterative reconstruction technique. CONTRAST:  OMNIPAQUE IOHEXOL 300 MG/ML  SOLN COMPARISON:  CT abdomen/pelvis dated 11/03/2023. FINDINGS: Lower chest: No acute abnormality. Hepatobiliary: No focal liver abnormality is seen. Status post cholecystectomy. No biliary dilatation. Pancreas: Unremarkable. No pancreatic ductal dilatation or surrounding inflammatory changes. Spleen: Unchanged. Similar subcentimeter focal hypodensities are too small to definitively characterize and could represent small hemangiomas or cysts. Adrenals/Urinary Tract: Adrenal glands are unremarkable. Kidneys are normal, without renal calculi, focal lesion, or hydronephrosis. Bladder is unremarkable. Stomach/Bowel: Stomach is within normal limits. No evidence of bowel wall thickening, distention, or inflammatory changes.  Scattered colonic diverticula without evidence of acute diverticulitis. Vascular/Lymphatic: The abdominal aorta is normal in caliber with atherosclerotic calcification no enlarged abdominal or pelvic lymph nodes. Reproductive: Prostate is unremarkable. Other: No abdominopelvic ascites. No intraperitoneal free air. Tiny fat containing umbilical and right inguinal hernias. Musculoskeletal: No acute osseous abnormality. No suspicious osseous lesion. Degenerative changes of the spine. IMPRESSION: 1. No acute localizing findings in the abdomen or pelvis. 2. Scattered colonic diverticulosis without evidence of acute diverticulitis. 3. Aortic Atherosclerosis (ICD10-I70.0). Electronically Signed   By: Hart Robinsons M.D.   On: 12/09/2023 16:22   CT L-SPINE NO CHARGE Result Date: 12/09/2023 CLINICAL DATA:  Worsening back pain, right-sided abdominal pain. EXAM: CT LUMBAR SPINE WITHOUT CONTRAST TECHNIQUE: Multidetector CT imaging of the lumbar spine was performed without intravenous contrast administration. Multiplanar CT image reconstructions were also generated. RADIATION DOSE REDUCTION: This exam was performed according to the departmental dose-optimization program which includes automated exposure control, adjustment of the mA and/or kV according to patient size and/or use of iterative  reconstruction technique. COMPARISON:  MRI lumbar spine 04/09/2022, CT lumbar spine 02/26/2022 FINDINGS: Segmentation: Numbering of lumbar segments done in accordance with multiple prior studies. There are hypoplastic ribs at the level designated as T12. Four non-rib-bearing lumbar type vertebral bodies which are numbered L1 through L4. No transitional segment. The lowest well-formed disc space is designated as the space between the L4 vertebral body and superior sacrum (S1). Alignment: Lumbar lordosis is maintained. Trace retrolisthesis of T12 on L1 and L1 on L2 similar to prior. Levocurvature of the lumbar spine centered at L1-2.  Vertebrae: No displaced fracture or compression fracture in the lumbar spine. Mild chronic deformity of the L2 inferior endplate again noted. No suspicious lytic or blastic osseous lesion. Degenerative endplate osteophytes most pronounced in the lower thoracic spine. Paraspinal and other soft tissues: The paraspinal musculature is unremarkable. Mild atherosclerosis of the abdominal aorta and branch vessels. Disc levels: Mild disc space narrowing posteriorly at the L4-S1 level again noted. Intervertebral disc spaces otherwise maintained. T11-12: No significant spinal canal stenosis. Asymmetric facet arthrosis on the right. Mild right foraminal stenosis. T12-L1: Small disc bulge. Bilateral facet arthrosis. No significant spinal canal stenosis. Mild foraminal stenosis on the right. L1-2: Small disc bulge. Bilateral facet arthrosis. No significant spinal canal stenosis. L2-3: Disc bulge. Asymmetric facet arthrosis on the right. No significant spinal canal stenosis. Mild foraminal stenosis on the right. L3-4: Disc bulge. Bilateral facet arthrosis. No significant spinal canal stenosis. Mild right and moderate left foraminal stenosis. L4-S1: Similar appearance paracentral disc protrusion and posterior osteophytes. No significant spinal canal stenosis. Moderate bilateral foraminal stenosis. IMPRESSION: 1. No acute fracture or traumatic malalignment in the lumbar spine. 2. Lumbar spondylosis as described, similar to prior. Multilevel foraminal stenosis, moderate on the left at L3-4 and bilaterally at L4-S1. 3. Lumbar segment numbering as above, unchanged from prior. Electronically Signed   By: Emily Filbert M.D.   On: 12/09/2023 15:38    Procedures Procedures    Medications Ordered in ED Medications - No data to display  ED Course/ Medical Decision Making/ A&P                                 Medical Decision Making Amount and/or Complexity of Data Reviewed Labs: ordered. Radiology:  ordered.  Risk Prescription drug management.   Sheliah Plane Criss Alvine. 63 y.o. presented today for abd pain. Working DDx includes, but not limited to, gastroenteritis, colitis, SBO, appendicitis, cholecystitis, hepatobiliary pathology, gastritis, PUD, ACS, dissection, pancreatitis, nephrolithiasis, AAA, UTI, pyelonephritis, torsion.,  Osteomyelitis of spine, spinal stenosis, back strain  R/o DDx: These are considered less likely than current impression due to history of present illness, physical exam, labs/imaging findings.  Review of prior external notes: Reviewed office call 11/09/2023 in which patient had CT abd/pelvis ordered for suspected diverticulitis and was subsequently started on antibiotics for diverticulitis. I am unable to review imaging.  Pmhx: tonsillar cancer, diverticulitis, vertebral osteomyelitis  Unique Tests and My Interpretation:  CBC without leukocytosis and no anemia CMP with slightly elevated total bilirubin, normal liver enzymes.  Normal kidney function. Lipase is 26 UA with trace hemoglobin, negative nitrites and negative leukocytes  Imaging:  CT Abd/Pelvis and lumbar spine with contrast: evaluate for structural/surgical etiology of patients' severe abdominal pain.  Imaging shows no acute abnormalities in abdomen and pelvis.  He does have several degenerative changes of lumbar spine but these are consistent with prior imaging.  Discussed results and  plan of care with patient and family ember in room.  Problem List / ED Course / Critical interventions / Medication management  Patient reports with right flank pain.  Pain has been ongoing for approximately 2 months.  He is currently taking tramadol and thus has had some intermittent constipation.  No blood in his stool. He does not have any red flag symptoms that I feel warrant MRI imaging today for complain of back pain.  He does not have cancer, has been in remission for 4 years.  No history of IV drug use.  He  does not have any saddle anesthesia no lower incontinence of bowel or bladder.  He is ambulatory with steady gait.  Labs are reassuring without any leukocytosis.  No significant electrolyte abnormality.  He does have mild tenderness on exam thus will obtain imaging of abdomen pelvis and lumbar spine. Patient does not want narcotics for pain control, will give Toradol and reassess. Patient is feeling better.  Giving no acute findings in abdomen pelvis feel symptoms are likely consistent with lumbar musculoskeletal pain.  He does have appropriate follow-up.  He also has follow-up for work abdominal pain next month for colonoscopy with GI.  Given reassuring workup here he is stable for discharge. I ordered medication including Toradol  Reevaluation of the patient after these medicines showed that the patient improved Patients vitals assessed. Upon arrival patient is hemodynamically stable.  I have reviewed the patients home medicines and have made adjustments as needed   Consult: None  Plan:  F/u w/ PCP in 2-3d to ensure resolution of sx.  Patient was given return precautions. Patient stable for discharge at this time.  Patient educated on current sx/dx and verbalized understanding of plan. Return to ER w/ new or worsening sx.          Final Clinical Impression(s) / ED Diagnoses Final diagnoses:  Acute right-sided low back pain without sciatica    Rx / DC Orders ED Discharge Orders     None         Smitty Knudsen, PA-C 12/09/23 1643    Linwood Dibbles, MD 12/10/23 1343

## 2023-12-09 NOTE — ED Triage Notes (Addendum)
 Patient arrives with complaints of worsening back pain and right side abdominal pain. Patient states that he had imaging of his abdomen that revealed constipation.  Patient also reports a history of Diverticulitis.  Currently on two antibiotics since earlier this Monday.

## 2023-12-09 NOTE — Discharge Instructions (Signed)
 He has follow-up with GI in regards to your abdominal pain and consider colonoscopy.  I have sent naproxen to your pharmacy to take twice daily with food.  I have also sent lidocaine patch which you can use daily.  I sent a muscle relaxer called Robaxin to your pharmacy take as needed do not drive or operate heavy machinery while taking.  You can also use ice and heat.

## 2024-01-17 ENCOUNTER — Ambulatory Visit: Payer: 59 | Admitting: Gastroenterology

## 2024-01-17 ENCOUNTER — Encounter: Payer: Self-pay | Admitting: Gastroenterology

## 2024-01-17 VITALS — BP 130/74 | HR 51 | Ht 73.0 in | Wt 219.4 lb

## 2024-01-17 DIAGNOSIS — K219 Gastro-esophageal reflux disease without esophagitis: Secondary | ICD-10-CM | POA: Diagnosis not present

## 2024-01-17 DIAGNOSIS — K5909 Other constipation: Secondary | ICD-10-CM | POA: Diagnosis not present

## 2024-01-17 DIAGNOSIS — R109 Unspecified abdominal pain: Secondary | ICD-10-CM | POA: Diagnosis not present

## 2024-01-17 DIAGNOSIS — F17290 Nicotine dependence, other tobacco product, uncomplicated: Secondary | ICD-10-CM

## 2024-01-17 DIAGNOSIS — Z8711 Personal history of peptic ulcer disease: Secondary | ICD-10-CM

## 2024-01-17 DIAGNOSIS — Z1211 Encounter for screening for malignant neoplasm of colon: Secondary | ICD-10-CM

## 2024-01-17 DIAGNOSIS — Z85818 Personal history of malignant neoplasm of other sites of lip, oral cavity, and pharynx: Secondary | ICD-10-CM

## 2024-01-17 MED ORDER — NA SULFATE-K SULFATE-MG SULF 17.5-3.13-1.6 GM/177ML PO SOLN: 1.0000 | Freq: Once | ORAL | 0 refills | Status: AC

## 2024-01-17 MED ORDER — TRULANCE 3 MG PO TABS: 3.0000 mg | ORAL_TABLET | Freq: Every day | ORAL | 3 refills | Status: AC

## 2024-01-17 MED ORDER — OMEPRAZOLE 20 MG PO CPDR
20.0000 mg | DELAYED_RELEASE_CAPSULE | Freq: Every day | ORAL | 4 refills | Status: AC
Start: 2024-01-17 — End: ?

## 2024-01-17 NOTE — Progress Notes (Signed)
 Chief Complaint:   Referring Provider:  Audie Pinto, FNP      ASSESSMENT AND PLAN;   #1. R flank pain likely related to back pain. Neg CT AP 11/2023 except spinal stenosis  #3. Chronic constipation. Due for screening colon  #3. GERD. H/O gastric ulcers  #4. 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    Discussed risks & benefits of colonoscopy. Risks including rare perforation req laparotomy, bleeding after bx/polypectomy req blood transfusion, rarely missing neoplasms, risks of anesthesia/sedation, rare risk of damage to internal organs. Benefits outweigh the risks. Patient agrees to proceed. All the questions were answered. Pt consents to proceed.  HPI:    Christopher [name]. is a 63 y.o. 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  History of Present Illness Christopher [name]. is a 63 year old male with a history of spinal stenosis and tonsillary cancer who presents with abdominal pain and constipation.  He has been experiencing abdominal pain in the right upper flank area for the past two days. The pain is described as a 'catch' when he moves and is more noticeable in the morning upon waking. He is concerned about the pain due to its proximity to the kidney area.  He has a history of constipation with bowel movements occurring every two to three days. The stool is not hard, but he feels 'blown up' when he finally has a bowel movement. He sometimes needs to strain before finishing a bowel movement but denies any blood in the stool. He uses stool  softeners and Gas-X to manage his symptoms and has tried Miralax and Trulance, with Trulance being effective within an hour of use. A CT scan in January indicated his colon was 'over full.'  In April 2023, an upper endoscopy revealed several linear, clean-based ulcers in the gastric body. He has been experiencing heartburn recently and has been taking Releuko for it. He underwent a colonoscopy in April 2023, which was not fully successful due to poor bowel preparation.  He has a past medical history of tonsillary cancer, treated with radiation and chemotherapy four years ago, resulting in significant weight loss. He reports occasional difficulty swallowing certain foods, such as pork chops. 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  He has a history of spinal stenosis, with a CT scan in February confirming the diagnosis. He also has a history of a severe spinal infection two years ago, which required hospitalization and a PICC line for antibiotic treatment.  He used to drink a lot of soda but is trying to drink more water now. He rides motorcycles and plans to attend a motorcycle rally.     Past GI workup: CT AP 12/09/2023 with contrast 1. No acute localizing findings in the abdomen or pelvis. 2. Scattered colonic diverticulosis without evidence of acute diverticulitis. 3. Aortic Atherosclerosis (ICD10-I70.0).  CT spine 12/09/2023 1. No acute fracture or traumatic malalignment in the lumbar spine. 2. Lumbar spondylosis as described, similar to prior. Multilevel foraminal stenosis, moderate on  the left at L3-4 and bilaterally at L4-S1. 3. Lumbar segment numbering as above, unchanged from prior.  EGD 01/27/2022 - NG trauma - Superficial gastric ulcers - Negative biopsies for H. Pylori  Colon 01/28/2022: fair prep. Rpt in 1 year.  Past Medical History:  Diagnosis Date   Arthritis    Colon polyps    Prostatitis    Restless  leg    Sleep apnea    Thyroid disease    Tonsillar cancer (HCC) 09/2019   SCC - 3 chemo, 35 radiation treatments    Past Surgical History:  Procedure Laterality Date   CHOLECYSTECTOMY     HERNIA REPAIR     IR FLUORO GUIDED NEEDLE PLC ASPIRATION/INJECTION LOC  03/01/2022    Family History  Problem Relation Age of Onset   Alcoholism Mother    Alcoholism Father    Colon cancer Neg Hx    Esophageal cancer Neg Hx    Stomach cancer Neg Hx     Social History   Tobacco Use   Smoking status: Never   Smokeless tobacco: Current    Types: Snuff   Tobacco comments:    Uses nicotine replacement pouch  Substance Use Topics   Alcohol use: Yes    Alcohol/week: 2.0 standard drinks of alcohol    Types: 2 Cans of beer per week    Comment: rare   Drug use: Not Currently    Current Outpatient Medications  Medication Sig Dispense Refill   celecoxib (CELEBREX) 200 MG capsule Take 200 mg by mouth.     gabapentin (NEURONTIN) 600 MG tablet Take 600 mg by mouth at bedtime.     levothyroxine (SYNTHROID) 112 MCG tablet Take 1 tablet by mouth daily.     lidocaine (LIDODERM) 5 % Place 1 patch onto the skin daily. Remove & Discard patch within 12 hours or as directed by MD 30 patch 0   traMADol (ULTRAM) 50 MG tablet Take 50-100 mg by mouth every 6 (six) hours as needed for moderate pain.     No current facility-administered medications for this visit.    Allergies  Allergen Reactions   Losartan Cough    Review of Systems:  Constitutional: Denies fever, chills, diaphoresis, appetite change and fatigue.  HEENT: Denies photophobia, eye pain, redness, hearing loss, ear pain, congestion, sore throat, rhinorrhea, sneezing, mouth sores, neck pain, neck stiffness and tinnitus.   Respiratory: Denies SOB, DOE, cough, chest tightness,  and wheezing.   Cardiovascular: Denies chest pain, palpitations and leg swelling.  Genitourinary: Denies dysuria, urgency, frequency, hematuria, flank pain and  difficulty urinating.  Musculoskeletal: Denies myalgias, has back pain, joint swelling, arthralgias and gait problem.  Skin: No rash.  Neurological: Denies dizziness, seizures, syncope, weakness, light-headedness, numbness and headaches.  Hematological: Denies adenopathy. Easy bruising, personal or family bleeding history  Psychiatric/Behavioral: No anxiety or depression     Physical Exam:    BP 130/74   Pulse (!) 51   Ht 6\' 1"  (1.854 m)   Wt 219 lb 6.4 oz (99.5 kg)   SpO2 96%   BMI 28.95 kg/m  Wt Readings from Last 3 Encounters:  01/17/24 219 lb 6.4 oz (99.5 kg)  12/09/23 215 lb (97.5 kg)  06/16/22 201 lb (91.2 kg)   Constitutional:  Well-developed, in no acute distress. Psychiatric: Normal mood and affect. Behavior is normal. HEENT: Pupils normal.  Conjunctivae are normal. No scleral icterus. Cardiovascular: Normal rate, regular rhythm. No edema Pulmonary/chest: Effort normal and breath sounds normal. No wheezing, rales  or rhonchi. Abdominal: Soft, nondistended. Nontender. Bowel sounds active throughout. There are no masses palpable. No hepatomegaly. Rectal: Deferred Neurological: Alert and oriented to person place and time. Skin: Skin is warm and dry. No rashes noted.  Data Reviewed: I have personally reviewed following labs and imaging studies  CBC:    Latest Ref Rng & Units 12/09/2023   11:44 AM 05/04/2022   10:24 AM 03/31/2022    2:51 PM  CBC  WBC 4.0 - 10.5 K/uL 6.3  8.0  5.3   Hemoglobin 13.0 - 17.0 g/dL 40.9  81.1  91.4   Hematocrit 39.0 - 52.0 % 48.1  47.1  39.8   Platelets 150 - 400 K/uL 158  180  182     CMP:    Latest Ref Rng & Units 12/09/2023   11:44 AM 05/04/2022   10:24 AM 04/14/2022    3:36 AM  CMP  Glucose 70 - 99 mg/dL 782  956  213   BUN 8 - 23 mg/dL 17  28  22    Creatinine 0.61 - 1.24 mg/dL 0.86  5.78  4.69   Sodium 135 - 145 mmol/L 138  138  140   Potassium 3.5 - 5.1 mmol/L 4.1  4.0  4.0   Chloride 98 - 111 mmol/L 104  103  105   CO2 22 -  32 mmol/L 26  26  26    Calcium 8.9 - 10.3 mg/dL 9.4  9.8  9.8   Total Protein 6.5 - 8.1 g/dL 7.1  7.3  7.0   Total Bilirubin 0.0 - 1.2 mg/dL 1.8  1.1  0.6   Alkaline Phos 38 - 126 U/L 59     AST 15 - 41 U/L 23  16  12    ALT 0 - 44 U/L 33  29  23         Edman Circle, MD 01/17/2024, 10:58 AM  Cc: Audie Pinto, FNP

## 2024-01-17 NOTE — Patient Instructions (Addendum)
 _______________________________________________________  If your blood pressure at your visit was 140/90 or greater, please contact your primary care physician to follow up on this.  _______________________________________________________  If you are age 62 or older, your body mass index should be between 23-30. Your Body mass index is 28.95 kg/m. If this is out of the aforementioned range listed, please consider follow up with your Primary Care Provider.  If you are age 29 or younger, your body mass index should be between 19-25. Your Body mass index is 28.95 kg/m. If this is out of the aformentioned range listed, please consider follow up with your Primary Care Provider.   ________________________________________________________  The [facility] providers would like to encourage you to use [facility] to communicate with providers for non-urgent requests or questions.  Due to long hold times on the telephone, sending your provider a message by [facility] may be a faster and more efficient way to get a response.  Please allow 48 business hours for a response.  Please remember that this is for non-urgent requests.  _______________________________________________________  We have sent the following medications to your pharmacy for you to pick up at your convenience: Omeprazole 20mg  daily  Please purchase the following medications over the counter and take as directed: Miralax 17g daily  We have sent the following medications to your pharmacy for you to pick up at your convenience: Suprep  Two days before your procedure: Mix 3 packs (or capfuls) of Miralax in 48 ounces of clear liquid and drink at 6pm.  You have been scheduled for a colonoscopy. Please follow written instructions given to you at your visit today.   If you use inhalers (even only as needed), please bring them with you on the day of your procedure.  DO NOT TAKE 7 DAYS PRIOR TO TEST- Trulicity (dulaglutide) Ozempic, Wegovy  (semaglutide) Mounjaro (tirzepatide) Bydureon Bcise (exanatide extended release)  DO NOT TAKE 1 DAY PRIOR TO YOUR TEST Rybelsus (semaglutide) Adlyxin (lixisenatide) Victoza (liraglutide) Byetta (exanatide) ___________________________________________________________________________  Thank you,  Dr. Lynann Bologna

## 2024-03-07 ENCOUNTER — Encounter: Payer: Self-pay | Admitting: Gastroenterology

## 2024-03-12 ENCOUNTER — Other Ambulatory Visit: Payer: Self-pay | Admitting: Medical Genetics

## 2024-03-16 ENCOUNTER — Ambulatory Visit: Admitting: Gastroenterology

## 2024-03-16 ENCOUNTER — Encounter: Payer: Self-pay | Admitting: Gastroenterology

## 2024-03-16 VITALS — BP 93/62 | HR 50 | Temp 98.2°F | Resp 13 | Ht 73.0 in | Wt 219.0 lb

## 2024-03-16 DIAGNOSIS — Z1211 Encounter for screening for malignant neoplasm of colon: Secondary | ICD-10-CM | POA: Diagnosis present

## 2024-03-16 DIAGNOSIS — K5730 Diverticulosis of large intestine without perforation or abscess without bleeding: Secondary | ICD-10-CM

## 2024-03-16 DIAGNOSIS — D122 Benign neoplasm of ascending colon: Secondary | ICD-10-CM | POA: Diagnosis not present

## 2024-03-16 DIAGNOSIS — K5909 Other constipation: Secondary | ICD-10-CM

## 2024-03-16 DIAGNOSIS — K640 First degree hemorrhoids: Secondary | ICD-10-CM

## 2024-03-16 MED ORDER — SODIUM CHLORIDE 0.9 % IV SOLN: 500.0000 mL | Freq: Once | INTRAVENOUS | Status: DC

## 2024-03-16 NOTE — Progress Notes (Signed)
 Sedate, gd SR, tolerated procedure well, VSS, report to RN

## 2024-03-16 NOTE — Progress Notes (Signed)
 Called to room to assist during endoscopic procedure.  Patient ID and intended procedure confirmed with present staff. Received instructions for my participation in the procedure from the performing physician.

## 2024-03-16 NOTE — Progress Notes (Signed)
 Last used nicotine  pouch 03-15-24 pm

## 2024-03-16 NOTE — Patient Instructions (Signed)
  Resume previous diet Continue present medications Use Benefiber - 1 tsp by mouth daily with 8 oz water Miralax  - 1 capful (17 gms) in 8 ounces of water every day Await pathology results   YOU HAD AN ENDOSCOPIC PROCEDURE TODAY AT THE [facility]:   Refer to the procedure report that was given to you for any specific questions about what was found during the examination.  If the procedure report does not answer your questions, please call your gastroenterologist to clarify.  If you requested that your care partner not be given the details of your procedure findings, then the procedure report has been included in a sealed envelope for you to review at your convenience later.  YOU SHOULD EXPECT: Some feelings of bloating in the abdomen. Passage of more gas than usual.  Walking can help get rid of the air that was put into your GI tract during the procedure and reduce the bloating. If you had a lower endoscopy (such as a colonoscopy or flexible sigmoidoscopy) you may notice spotting of blood in your stool or on the toilet paper. If you underwent a bowel prep for your procedure, you may not have a normal bowel movement for a few days.  Please Note:  You might notice some irritation and congestion in your nose or some drainage.  This is from the oxygen used during your procedure.  There is no need for concern and it should clear up in a day or so.  SYMPTOMS TO REPORT IMMEDIATELY:  Following lower endoscopy (colonoscopy or flexible sigmoidoscopy):  Excessive amounts of blood in the stool  Significant tenderness or worsening of abdominal pains  Swelling of the abdomen that is new, acute  Fever of 100F or higher    For urgent or emergent issues, a gastroenterologist can be reached at any hour by calling [phone]. Do not use MyChart messaging for urgent concerns.    DIET:  We do recommend a small meal at first, but then you may proceed to your regular diet.  Drink plenty of  fluids but you should avoid alcoholic beverages for 24 hours.  ACTIVITY:  You should plan to take it easy for the rest of today and you should NOT DRIVE or use heavy machinery until tomorrow (because of the sedation medicines used during the test).    FOLLOW UP: Our staff will call the number listed on your records the next business day following your procedure.  We will call around 7:15- 8:00 am to check on you and address any questions or concerns that you may have regarding the information given to you following your procedure. If we do not reach you, we will leave a message.     If any biopsies were taken you will be contacted by phone or by letter within the next 1-3 weeks.  Please call us  at [phone] if you have not heard about the biopsies in 3 weeks.    SIGNATURES/CONFIDENTIALITY: You and/or your care partner have signed paperwork which will be entered into your electronic medical record.  These signatures attest to the fact that that the information above on your After Visit Summary has been reviewed and is understood.  Full responsibility of the confidentiality of this discharge information lies with you and/or your care-partner.

## 2024-03-16 NOTE — Progress Notes (Signed)
 Chief Complaint:   Referring Provider:  Mina Alter, FNP      ASSESSMENT AND PLAN;   #1. R flank pain likely related to back pain. Neg CT AP 11/2023 except spinal stenosis  #3. Chronic constipation. Due for screening colon  #3. GERD. H/O gastric ulcers  #4. 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    Discussed risks & benefits of colonoscopy. Risks including rare perforation req laparotomy, bleeding after bx/polypectomy req blood transfusion, rarely missing neoplasms, risks of anesthesia/sedation, rare risk of damage to internal organs. Benefits outweigh the risks. Patient agrees to proceed. All the questions were answered. Pt consents to proceed.  HPI:    Christopher [name]. is a 63 y.o. 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  History of Present Illness Christopher [name]. is a 63 year old male with a history of spinal stenosis and tonsillary cancer who presents with abdominal pain and constipation.  He has been experiencing abdominal pain in the right upper flank area for the past two days. The pain is described as a 'catch' when he moves and is more noticeable in the morning upon waking. He is concerned about the pain due to its proximity to the kidney area.  He has a history of constipation with bowel movements occurring every two to three days. The stool is not hard, but he feels 'blown up' when he finally has a bowel movement. He sometimes needs to strain before finishing a bowel movement but denies any blood in the stool. He uses stool  softeners and Gas-X to manage his symptoms and has tried Miralax  and Trulance , with Trulance  being effective within an hour of use. A CT scan in January indicated his colon was 'over full.'  In April 2023, an upper endoscopy revealed several linear, clean-based ulcers in the gastric body. He has been experiencing heartburn recently and has been taking Releuko for it. He underwent a colonoscopy in April 2023, which was not fully successful due to poor bowel preparation.  He has a past medical history of tonsillary cancer, treated with radiation and chemotherapy four years ago, resulting in significant weight loss. He reports occasional difficulty swallowing certain foods, such as pork chops. No history of prostate cancer but mentions a history of prostate infections since the age of 47.  He has a history of spinal stenosis, with a CT scan in February confirming the diagnosis. He also has a history of a severe spinal infection two years ago, which required hospitalization and a PICC line for antibiotic treatment.  He used to drink a lot of soda but is trying to drink more water now. He rides motorcycles and plans to attend a motorcycle rally.     Past GI workup: CT AP 12/09/2023 with contrast 1. No acute localizing findings in the abdomen or pelvis. 2. Scattered colonic diverticulosis without evidence of acute diverticulitis. 3. Aortic Atherosclerosis (ICD10-I70.0).  CT spine 12/09/2023 1. No acute fracture or traumatic malalignment in the lumbar spine. 2. Lumbar spondylosis as described, similar to prior. Multilevel foraminal stenosis, moderate on  the left at L3-4 and bilaterally at L4-S1. 3. Lumbar segment numbering as above, unchanged from prior.  EGD 01/27/2022 - NG trauma - Superficial gastric ulcers - Negative biopsies for H. Pylori  Colon 01/28/2022: fair prep. Rpt in 1 year.  Past Medical History:  Diagnosis Date   Arthritis    Colon polyps    GERD (gastroesophageal  reflux disease)    Prostatitis    Restless leg    Sleep apnea    no CPAP worn   Thyroid disease    Tonsillar cancer (HCC) 09/2019   SCC - 3 chemo, 35 radiation treatments    Past Surgical History:  Procedure Laterality Date   CHOLECYSTECTOMY     COLONOSCOPY     HERNIA REPAIR     IR FLUORO GUIDED NEEDLE PLC ASPIRATION/INJECTION LOC  03/01/2022    Family History  Problem Relation Age of Onset   Alcoholism Mother    Alcoholism Father    Colon cancer Neg Hx    Esophageal cancer Neg Hx    Stomach cancer Neg Hx    Rectal cancer Neg Hx     Social History   Tobacco Use   Smoking status: Never   Smokeless tobacco: Current    Types: Snuff   Tobacco comments:    Uses nicotine  replacement pouch  Substance Use Topics   Alcohol use: Yes    Alcohol/week: 2.0 standard drinks of alcohol    Types: 2 Cans of beer per week    Comment: rare   Drug use: Not Currently    Current Outpatient Medications  Medication Sig Dispense Refill   celecoxib (CELEBREX) 200 MG capsule Take 1 capsule by mouth 2 (two) times daily.     gabapentin  (NEURONTIN ) 600 MG tablet Take 600 mg by mouth at bedtime.     levothyroxine (SYNTHROID) 112 MCG tablet Take 1 tablet by mouth daily.     omeprazole  (PRILOSEC) 20 MG capsule Take 1 capsule (20 mg total) by mouth daily. 90 capsule 4   traMADol (ULTRAM) 50 MG tablet Take 50-100 mg by mouth every 6 (six) hours as needed for moderate pain.     lidocaine  (LIDODERM ) 5 % Place 1 patch onto the skin daily. Remove & Discard patch within 12 hours or as directed by MD (Patient not taking: Reported on 03/16/2024) 30 patch 0   Plecanatide  (TRULANCE ) 3 MG TABS Take 1 tablet (3 mg total) by mouth daily. 30 tablet 3   Current Facility-Administered Medications  Medication Dose Route Frequency Provider Last Rate Last Admin   0.9 %  sodium chloride  infusion  500 mL Intravenous Once Lajuan Pila, MD        Allergies  Allergen Reactions   Losartan Cough    Review of  Systems:  Constitutional: Denies fever, chills, diaphoresis, appetite change and fatigue.  HEENT: Denies photophobia, eye pain, redness, hearing loss, ear pain, congestion, sore throat, rhinorrhea, sneezing, mouth sores, neck pain, neck stiffness and tinnitus.   Respiratory: Denies SOB, DOE, cough, chest tightness,  and wheezing.   Cardiovascular: Denies chest pain, palpitations and leg swelling.  Genitourinary: Denies dysuria, urgency, frequency, hematuria, flank pain and difficulty urinating.  Musculoskeletal: Denies myalgias, has back pain, joint swelling, arthralgias and gait problem.  Skin: No rash.  Neurological: Denies dizziness, seizures, syncope, weakness, light-headedness, numbness and headaches.  Hematological: Denies adenopathy. Easy bruising, personal or family bleeding history  Psychiatric/Behavioral: No anxiety or depression     Physical Exam:    BP (!) 128/90  Pulse (!) 57   Temp 98.2 F (36.8 C)   Ht 6\' 1"  (1.854 m)   Wt 219 lb (99.3 kg)   SpO2 97%   BMI 28.89 kg/m  Wt Readings from Last 3 Encounters:  03/16/24 219 lb (99.3 kg)  01/17/24 219 lb 6.4 oz (99.5 kg)  12/09/23 215 lb (97.5 kg)   Constitutional:  Well-developed, in no acute distress. Psychiatric: Normal mood and affect. Behavior is normal. HEENT: Pupils normal.  Conjunctivae are normal. No scleral icterus. Cardiovascular: Normal rate, regular rhythm. No edema Pulmonary/chest: Effort normal and breath sounds normal. No wheezing, rales or rhonchi. Abdominal: Soft, nondistended. Nontender. Bowel sounds active throughout. There are no masses palpable. No hepatomegaly. Rectal: Deferred Neurological: Alert and oriented to person place and time. Skin: Skin is warm and dry. No rashes noted.  Data Reviewed: I have personally reviewed following labs and imaging studies  CBC:    Latest Ref Rng & Units 12/09/2023   11:44 AM 05/04/2022   10:24 AM 03/31/2022    2:51 PM  CBC  WBC 4.0 - 10.5 K/uL 6.3  8.0   5.3   Hemoglobin 13.0 - 17.0 g/dL 62.1  30.8  65.7   Hematocrit 39.0 - 52.0 % 48.1  47.1  39.8   Platelets 150 - 400 K/uL 158  180  182     CMP:    Latest Ref Rng & Units 12/09/2023   11:44 AM 05/04/2022   10:24 AM 04/14/2022    3:36 AM  CMP  Glucose 70 - 99 mg/dL 846  962  952   BUN 8 - 23 mg/dL 17  28  22    Creatinine 0.61 - 1.24 mg/dL 8.41  3.24  4.01   Sodium 135 - 145 mmol/L 138  138  140   Potassium 3.5 - 5.1 mmol/L 4.1  4.0  4.0   Chloride 98 - 111 mmol/L 104  103  105   CO2 22 - 32 mmol/L 26  26  26    Calcium 8.9 - 10.3 mg/dL 9.4  9.8  9.8   Total Protein 6.5 - 8.1 g/dL 7.1  7.3  7.0   Total Bilirubin 0.0 - 1.2 mg/dL 1.8  1.1  0.6   Alkaline Phos 38 - 126 U/L 59     AST 15 - 41 U/L 23  16  12    ALT 0 - 44 U/L 33  29  23         Magnus Schuller, MD 03/16/2024, 9:36 AM  Cc: Mina Alter, FNP

## 2024-03-16 NOTE — Op Note (Signed)
 [facility] Patient Name: Christopher [name] Procedure Date: 03/16/2024 9:35 AM MRN: [id number] Endoscopist: Lajuan Pila , MD, [id number] Age: 63 Referring MD:  Date of Birth: 05/11/1961 Gender: Male Account #: [id number] Procedure:                Colonoscopy Indications:              Screening for colorectal malignant neoplasm. H/O                            chronic constipation Medicines:                Monitored Anesthesia Care Procedure:                Pre-Anesthesia Assessment:                           - Prior to the procedure, a History and Physical                            was performed, and patient medications and                            allergies were reviewed. The patient's tolerance of                            previous anesthesia was also reviewed. The risks                            and benefits of the procedure and the sedation                            options and risks were discussed with the patient.                            All questions were answered, and informed consent                            was obtained. Prior Anticoagulants: The patient has                            taken no anticoagulant or antiplatelet agents. ASA                            Grade Assessment: II - A patient with mild systemic                            disease. After reviewing the risks and benefits,                            the patient was deemed in satisfactory condition to                            undergo the procedure.  After obtaining informed consent, the colonoscope                            was passed under direct vision. Throughout the                            procedure, the patient's blood pressure, pulse, and                            oxygen saturations were monitored continuously. The                            Olympus Scope SN: [id number] was introduced through                            the anus and advanced to the 2 cm into  the ileum.                            The colonoscopy was performed without difficulty.                            The patient tolerated the procedure well. The                            quality of the bowel preparation was good. The                            terminal ileum, ileocecal valve, appendiceal                            orifice, and rectum were photographed. Scope In: 9:44:13 AM Scope Out: 9:58:36 AM Scope Withdrawal Time: 0 hours 12 minutes 4 seconds  Total Procedure Duration: 0 hours 14 minutes 23 seconds  Findings:                 A 4 mm polyp was found in the proximal ascending                            colon. The polyp was sessile. The polyp was removed                            with a cold biopsy forceps. The polyp was removed                            with a cold snare. Resection and retrieval were                            complete.                           A few rare small-mouthed diverticula were found in                            the sigmoid colon.  Non-bleeding internal hemorrhoids were found during                            retroflexion. The hemorrhoids were Grade I                            (internal hemorrhoids that do not prolapse).                           The terminal ileum appeared normal.                           The exam was otherwise without abnormality on                            direct and retroflexion views. Complications:            No immediate complications. Estimated Blood Loss:     Estimated blood loss: none. Impression:               - One 4 mm polyp in the proximal ascending colon,                            removed with a cold biopsy forceps and removed with                            a cold snare. Resected and retrieved.                           - Minimal sigmoid diverticulosis                           - Non-bleeding internal hemorrhoids.                           - The examined portion of the ileum was  normal.                           - The examination was otherwise normal on direct                            and retroflexion views. Recommendation:           - Patient has a contact number available for                            emergencies. The signs and symptoms of potential                            delayed complications were discussed with the                            patient. Return to normal activities tomorrow.                            Written discharge instructions were provided to the  patient.                           - Resume previous diet.                           - Continue present medications.                           - Use Benefiber one teaspoon PO daily with 8oz                            water.                           - Miralax  1 capful (17 grams) in 8 ounces of water                            PO QD                           - Await pathology results.                           - Repeat colonoscopy for surveillance based on                            pathology results.                           - The findings and recommendations were discussed                            with the patient's family. Lajuan Pila, MD 03/16/2024 10:04:01 AM This report has been signed electronically.

## 2024-03-19 ENCOUNTER — Telehealth: Payer: Self-pay

## 2024-03-19 NOTE — Telephone Encounter (Signed)
 Attempted to reach patient for post-procedure f/u call. No answer. Left message for him to please not hesitate to call if he has any questions/concerns regarding his care.

## 2024-03-24 LAB — SURGICAL PATHOLOGY

## 2024-03-25 ENCOUNTER — Ambulatory Visit: Payer: Self-pay | Admitting: Gastroenterology

## 2024-05-07 ENCOUNTER — Encounter: Payer: Self-pay | Admitting: Podiatry

## 2024-05-07 ENCOUNTER — Ambulatory Visit: Payer: Self-pay | Admitting: Podiatry

## 2024-05-07 DIAGNOSIS — L600 Ingrowing nail: Secondary | ICD-10-CM | POA: Diagnosis not present

## 2024-05-07 DIAGNOSIS — B351 Tinea unguium: Secondary | ICD-10-CM

## 2024-05-07 DIAGNOSIS — M79674 Pain in right toe(s): Secondary | ICD-10-CM | POA: Diagnosis not present

## 2024-05-07 DIAGNOSIS — M79675 Pain in left toe(s): Secondary | ICD-10-CM

## 2024-05-07 MED ORDER — NEOMYCIN-POLYMYXIN-HC 3.5-10000-1 OT SUSP: OTIC | 0 refills | Status: AC

## 2024-05-07 NOTE — Progress Notes (Signed)
 Subjective:  Patient ID: Christopher [name]., male    DOB: [birth year],  MRN: [id number]  Christopher [name]. presents to clinic today for:  Chief Complaint  Patient presents with   Fungal nails    Bilateral hallux nails are fungal and grow very slowly. His PCP told him they were to far gone and needed to come off. I do not know if that is where we are at yet. Not diabetic and no anti coag.   Patient comes in for above-stated complaint.  He has painful, dystrophic, discolored, fungal appearing first toenails on both feet.  They are difficult for him to maintain.  He has tried antifungal treatments in the past which provided temporary relief of symptoms.  He is interested in removal of the toenails today.  PCP is Goins, Darice BROCKS, FNP.  Allergies  Allergen Reactions   Losartan Cough    Review of Systems: Negative except as noted in the HPI.  Objective:  There were no vitals filed for this visit.  Josyah [name] Christopher [name]. is a pleasant 63 y.o. male in NAD. AAO x 3.  Vascular Examination: Capillary refill time is less than 3 seconds to toes bilateral. Palpable pedal pulses b/l LE. Digital hair present b/l. No pedal edema b/l. Skin temperature gradient WNL b/l. No varicosities b/l. No cyanosis or clubbing noted b/l.   Dermatological Examination: Left and right first toenail are thickened, elongated, dystrophic with yellow discoloration and subungual debris.  They are tender on direct dorsal palpation.  There are some incurvation of the nails as well.  Neurological Examination: Protective sensation intact with Semmes-Weinstein 10 gram monofilament b/l LE. Vibratory sensation intact b/l LE.  Musculoskeletal examination: Muscle strength 5/5 for all major muscle groups.  No symptomatic limitations in pedal range of motion.  No symptomatic pedal deformities      No data to display           Assessment/Plan: 1. Pain due to onychomycosis of toenails of both feet    2. Ingrown toenail of both feet     Meds ordered this encounter  Medications   neomycin -polymyxin-hydrocortisone (CORTISPORIN) 3.5-10000-1 OTIC suspension    Sig: Apply 2-3 drops daily after soaking and cover with bandaid    Dispense:  10 mL    Refill:  0   Discussed findings with patient.  He has tried courses of oral terbinafine in the past and has tried various over-the-counter options.  He is dissatisfied with the appearance of the nails and they do cause him pain.  He is unable to maintain himself.  Did suggest total nail avulsion with chemical matrixectomy.  He is agreeable to this.  Written consent obtained.   Discussed patient's condition today.  After obtaining patient consent, the bilateral first toe was anesthetized with a 50:50 mixture of 1% lidocaine  plain and 0.5% bupivacaine  plain for a total of 3cc's administered to each toe.  Upon confirmation of anesthesia, a freer elevator was utilized to free the left and right nail plate from the nail bed.  The nail plates were then avulsed proximal to the eponychium and removed in toto.  The area was inspected for any remaining spicules.  A chemical matrixectomy was performed with NaOH and neutralized with acetic acid solution.  Antibiotic ointment and a DSD were applied, followed by a Coban dressing.  Patient tolerated the anesthetic and procedure well and will f/u in 2-3 weeks for recheck.  Patient given post-procedure instructions for daily  20-minute Epsom salt soaks, antibiotic ointment and daily use of Bandaids until toe starts to dry / form eschar.    Return in about 2 weeks (around 05/21/2024) for Nail Check.   Ethan Saddler, DPM, AACFAS [facility]     [address] [place], [place] [address]                Office [phone]  Fax [phone]

## 2024-05-07 NOTE — Patient Instructions (Addendum)
 Place 1/4 cup of epsom salts in a quart of warm tap water.  Submerge your foot or feet in the solution and soak for 20 minutes.  This soak should be done twice a day.  Next, remove your foot or feet from solution, blot dry the affected area. Apply antibiotic drops and cover if instructed by your doctor.   IF YOUR SKIN BECOMES IRRITATED WHILE USING THESE INSTRUCTIONS, IT IS OKAY TO SWITCH TO  WHITE VINEGAR AND WATER.  As another alternative soak, you may use antibacterial soap and water.  Monitor for any signs/symptoms of infection. Call the office immediately if any occur or go directly to the emergency room. Call with any questions/concerns.  Continue to soak in manage toe into a dry scab starts to form.  Once scab is fully formed, can discontinue any soaking or bandaging or use of the antibiotic drops.

## 2024-05-21 ENCOUNTER — Encounter: Payer: Self-pay | Admitting: Podiatry

## 2024-05-21 ENCOUNTER — Ambulatory Visit: Admitting: Podiatry

## 2024-05-21 DIAGNOSIS — L600 Ingrowing nail: Secondary | ICD-10-CM | POA: Diagnosis not present

## 2024-05-21 DIAGNOSIS — B351 Tinea unguium: Secondary | ICD-10-CM | POA: Diagnosis not present

## 2024-05-21 DIAGNOSIS — M79674 Pain in right toe(s): Secondary | ICD-10-CM | POA: Diagnosis not present

## 2024-05-21 DIAGNOSIS — M79675 Pain in left toe(s): Secondary | ICD-10-CM

## 2024-05-21 NOTE — Progress Notes (Signed)
       Subjective:  Patient ID: Christopher [name]., male    DOB: Nov 13, 1960,  MRN: [id number]  Chief Complaint  Patient presents with   Nail Check    Total nail removal for bilateral hallux. Looks pretty good,there is still some drainage on the left hallux. No pain. Not diabetic and no anti coag.     Christopher [name]. presents to clinic today for f/u of Total Nail Avulsion with chemical matrixectomy to the bilateral first toes.  Denies any pain.  Doing pretty well.  Does have some slight drainage from the left more so than the right.  Has been keeping up with soaking and bandaging.  PCP is Goins, Darice BROCKS, FNP.  Allergies  Allergen Reactions   Losartan Cough    Objective:  There were no vitals filed for this visit.  Vascular Examination: Capillary refill time is less than 3 FU mild seconds to toes bilateral. Palpable pedal pulses b/l LE. Digital hair present b/l. No pedal edema b/l. Skin temperature gradient WNL b/l. No varicosities b/l. No cyanosis or clubbing noted b/l.   Dermatological Examination: Upon inspection of the  bilateral first toe toenail avulsion site, there are no clinical signs of infection.  No purulence, no necrosis, no malodor present.  Minimal to no erythema present.  Eschar formed along nail margin.  Minimal to no pain on palpation of area.  Scant serous oozing drainage present left greater than right.  Assessment/Plan: 1. Pain due to onychomycosis of toenails of both feet   2. Ingrown toenail of both feet     No orders of the defined types were placed in this encounter.  He is healing well at this point.  Did discuss that bruising may continue for another couple weeks.  Does not have signs of infection.  Did review this with patient and instructed him to let us  know if this occurs.  Continue to soak and bandage until scab is fully formed.  Return if symptoms worsen or fail to improve.   Ethan LITTIE Saddler, DPM, AACFAS [facility]     [address] [place], [place] [address]                Office [phone]  Fax [phone]

## 2024-07-23 ENCOUNTER — Emergency Department (HOSPITAL_COMMUNITY)
Admission: EM | Admit: 2024-07-23 | Discharge: 2024-07-24 | Discharge status: Against Medical Advice | Attending: Emergency Medicine | Admitting: Emergency Medicine

## 2024-07-23 ENCOUNTER — Emergency Department (HOSPITAL_COMMUNITY)

## 2024-07-23 ENCOUNTER — Encounter (HOSPITAL_COMMUNITY): Payer: Self-pay

## 2024-07-23 ENCOUNTER — Other Ambulatory Visit: Payer: Self-pay

## 2024-07-23 DIAGNOSIS — Z5321 Procedure and treatment not carried out due to patient leaving prior to being seen by health care provider: Secondary | ICD-10-CM | POA: Insufficient documentation

## 2024-07-23 DIAGNOSIS — R110 Nausea: Secondary | ICD-10-CM | POA: Insufficient documentation

## 2024-07-23 DIAGNOSIS — M25512 Pain in left shoulder: Secondary | ICD-10-CM | POA: Insufficient documentation

## 2024-07-23 DIAGNOSIS — R079 Chest pain, unspecified: Secondary | ICD-10-CM | POA: Diagnosis present

## 2024-07-23 DIAGNOSIS — M549 Dorsalgia, unspecified: Secondary | ICD-10-CM | POA: Insufficient documentation

## 2024-07-23 LAB — BASIC METABOLIC PANEL WITH GFR
Anion gap: 12 (ref 5–15)
BUN: 22 mg/dL (ref 8–23)
CO2: 22 mmol/L (ref 22–32)
Calcium: 9.3 mg/dL (ref 8.9–10.3)
Chloride: 97 mmol/L — ABNORMAL LOW (ref 98–111)
Creatinine, Ser: 1.06 mg/dL (ref 0.61–1.24)
GFR, Estimated: >60 mL/min (ref >60)
Glucose, Bld: 130 mg/dL — ABNORMAL HIGH (ref 70–99)
Potassium: 3.6 mmol/L (ref 3.5–5.1)
Sodium: 131 mmol/L — ABNORMAL LOW (ref 135–145)

## 2024-07-23 LAB — CBC
HCT: 50.8 % (ref 39.0–52.0)
Hemoglobin: 18.1 g/dL — ABNORMAL HIGH (ref 13.0–17.0)
MCH: 29.9 pg (ref 26.0–34.0)
MCHC: 35.6 g/dL (ref 30.0–36.0)
MCV: 83.8 fL (ref 80.0–100.0)
Platelets: 219 K/uL (ref 150–400)
RBC: 6.06 MIL/uL — ABNORMAL HIGH (ref 4.22–5.81)
RDW: 12.5 % (ref 11.5–15.5)
WBC: 7.1 K/uL (ref 4.0–10.5)
nRBC: 0 % (ref 0.0–0.2)

## 2024-07-23 LAB — TROPONIN I (HIGH SENSITIVITY)
Troponin I (High Sensitivity): 3 ng/L (ref <18)
Troponin I (High Sensitivity): 3 ng/L (ref <18)

## 2024-07-23 NOTE — ED Provider Triage Note (Signed)
 Emergency Medicine Provider Triage Evaluation Note  Christopher [name] Christopher [name]. , a 63 y.o. male  was evaluated in triage.  Pt complains of left chest pain radiating to left shoulder.  Constant feels like an ache.  Started this morning when he woke up. He has not noticed any exertional chest pain or exertional shortness of breath as of lately.  He has no history of heart disease.  Review of Systems  Positive: CP Negative: Shortness Of breath, cough fever  Physical Exam  BP (!) 143/95 (BP Location: Right Arm)   Pulse 83   Temp 98 F (36.7 C)   Resp 17   SpO2 98%  Gen:   Awake, no distress   Resp:  Normal effort  MSK:   Moves extremities without difficulty  Other:    Medical Decision Making  Medically screening exam initiated at 1:51 PM.  Appropriate orders placed.  Christopher [name]. was informed that the remainder of the evaluation will be completed by another provider, this initial triage assessment does not replace that evaluation, and the importance of remaining in the ED until their evaluation is complete.     Christopher Warren SAILOR, PA-C 07/23/24 1352

## 2024-07-23 NOTE — ED Triage Notes (Signed)
 PT presents POV for 5/10 CP, BP, L. Shoulder pain, HTN. Nauseous as well.

## 2024-07-23 NOTE — ED Notes (Signed)
 Pt called for vitals in the lobby. Pt could not be located.

## 2024-08-06 ENCOUNTER — Other Ambulatory Visit: Payer: Self-pay | Admitting: Medical Genetics

## 2024-08-06 DIAGNOSIS — Z006 Encounter for examination for normal comparison and control in clinical research program: Secondary | ICD-10-CM

## 2024-11-01 DIAGNOSIS — I10 Essential (primary) hypertension: Secondary | ICD-10-CM | POA: Insufficient documentation

## 2024-11-01 NOTE — Progress Notes (Signed)
 "    Cardiology Office Note   Date:  11/04/2024   ID:  Alexa Marney Vicci Mickey., DOB 1961-02-01, MRN [id number]  PCP:  Jackolyn Darice BROCKS, FNP  Cardiologist:   None Referring:  Jackolyn Darice BROCKS, FNP  Chief Complaint  Patient presents with   Hypertension      History of Present Illness: Heyward Douthit. is a 64 y.o. male who presents for evaluation of difficult to control BP.  He has had no past cardiac history other than distant POET (Plain Old Exercise Treadmill). He has had blood pressures that started creeping up after he was treated for tonsillar cancer.  Of note this was about the time he was not able to keep using his CPAP for sleep apnea.  For a while they were cycling through blood pressure medicines and I did review primary care office records.  He was referred in October and since then the combination of therapy he is on seems to be controlling his blood pressure well.  He is getting blood pressures of 130s over 80s for the most part.  He has not had any cardiac symptoms.  He routinely climbs 3 flights of stairs at work. The patient denies any new symptoms such as chest discomfort, neck or arm discomfort. There has been no new shortness of breath, PND or orthopnea. There have been no reported palpitations, presyncope or syncope.     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     celecoxib (CELEBREX) 200 MG capsule Take 1 capsule by mouth 2  (two) times daily.     levothyroxine (SYNTHROID) 112 MCG tablet Take 1 tablet by mouth daily.     lidocaine  (LIDODERM ) 5 % Place 1 patch onto the skin daily. Remove & Discard patch within 12 hours or as directed by MD 30 patch 0   losartan (COZAAR) 100 MG tablet Take 100 mg by mouth daily.     memantine (NAMENDA) 5 MG tablet TAKE 1 TABLET DAILY FOR 1 WEEK, THEN 1 TABLET TWICE A DAY FOR 1 WEEK, THEN 2 TABLETS IN THE MORNING AND 1 TABLET IN THE EVENING FOR 1 WEEK, THEN 2 TABLETS TWICE A DAY     omeprazole  (PRILOSEC) 20 MG capsule Take 1 capsule (20 mg total) by mouth daily. 90 capsule 4   pregabalin (LYRICA) 75 MG capsule Take 75 mg by mouth 2 (two) times daily.     tamsulosin  (FLOMAX ) 0.4 MG CAPS capsule Take 0.4 mg by mouth daily.     traMADol (ULTRAM) 50 MG tablet Take 50-100 mg by mouth every 6 (six) hours as needed for moderate pain.     gabapentin  (NEURONTIN ) 600 MG tablet Take 600 mg by mouth at bedtime. (Patient not taking: Reported on 11/02/2024)     neomycin -polymyxin-hydrocortisone (CORTISPORIN) 3.5-10000-1 OTIC suspension Apply 2-3 drops  daily after soaking and cover with bandaid 10 mL 0   Plecanatide  (TRULANCE ) 3 MG TABS Take 1 tablet (3 mg total) by mouth daily. 30 tablet 3   No current facility-administered medications for this visit.    Allergies:   Losartan    Social History:  The patient  reports that he has never smoked. His smokeless tobacco use includes snuff. He reports current alcohol use of about 2.0 standard drinks of alcohol per week. He reports that he does not currently use drugs.   Family History:  The patient's family history includes Alcoholism in his father and mother.    ROS:  Please see the history of present illness.   Otherwise, review of systems are positive for none.   All other systems are reviewed and negative.    PHYSICAL EXAM: VS:  BP 130/68   Pulse 67   Ht 6' 1 (1.854 m)   Wt 225 lb (102.1 kg)   SpO2 97%   BMI 29.69 kg/m  , BMI Body mass  index is 29.69 kg/m. GENERAL:  Well appearing HEENT:  Pupils equal round and reactive, fundi not visualized, oral mucosa unremarkable NECK:  No jugular venous distention, waveform within normal limits, carotid upstroke brisk and symmetric, no bruits, no thyromegaly LYMPHATICS:  No cervical, inguinal adenopathy LUNGS:  Clear to auscultation bilaterally BACK:  No CVA tenderness CHEST:  Unremarkable HEART:  PMI not displaced or sustained,S1 and S2 within normal limits, no S3, no S4, no clicks, no rubs, no murmurs ABD:  Flat, positive bowel sounds normal in frequency in pitch, no bruits, no rebound, no guarding, no midline pulsatile mass, no hepatomegaly, no splenomegaly EXT:  2 plus pulses throughout, no edema, no cyanosis no clubbing SKIN:  No rashes no nodules NEURO:  Cranial nerves II through XII grossly intact, motor grossly intact throughout [facility]:  Cognitively intact, oriented to person place and time    EKG:     07/23/2024 sinus rhythm, left axis deviation, left anterior fascicular block, poor anterior R wave progression, no acute ST-T wave changes.  No change from previous.    Recent Labs: 12/09/2023: ALT 33 07/23/2024: BUN 22; Creatinine, Ser 1.06; Hemoglobin 18.1; Platelets 219; Potassium 3.6; Sodium 131    Lipid Panel No results found for: CHOL, TRIG, HDL, CHOLHDL, VLDL, LDLCALC, LDLDIRECT    Wt Readings from Last 3 Encounters:  11/02/24 225 lb (102.1 kg)  07/23/24 221 lb (100.2 kg)  03/16/24 219 lb (99.3 kg)      Other studies Reviewed: Additional studies/ records that were reviewed today include: Primary care office records and labs. Review of the above records demonstrates:  Please see elsewhere in the note.     ASSESSMENT AND PLAN:   Hypertension: His blood pressure actually is now well-controlled.  He is on a reasonable regimen.  I have asked him to keep a blood pressure diary and I be happy to review this.  However, at this point I do not think  evaluation for secondary causes is necessary.  I do see his routine blood work is unremarkable.  Continue current medications.  Risk reduction: He was blood pressure is not controlled.  His lipids are reasonable.  He is physically active though he could exercise more.  We did have a long discussion about diet.  Current medicines are reviewed at length with the patient today.  The patient does not have concerns regarding medicines.  The following changes have been made:  no change  Labs/ tests ordered today  include:  No orders of the defined types were placed in this encounter.    Disposition:   FU with me as needed.     Signed, Lynwood Schilling, MD  11/04/2024 11:09 AM     [facility]    "

## 2024-11-02 ENCOUNTER — Encounter: Payer: Self-pay | Admitting: Cardiology

## 2024-11-02 ENCOUNTER — Ambulatory Visit: Attending: Cardiology | Admitting: Cardiology

## 2024-11-02 VITALS — BP 130/68 | HR 67 | Ht 73.0 in | Wt 225.0 lb

## 2024-11-02 DIAGNOSIS — I10 Essential (primary) hypertension: Secondary | ICD-10-CM | POA: Diagnosis not present

## 2024-11-02 NOTE — Patient Instructions (Signed)

## 2024-11-04 ENCOUNTER — Encounter: Payer: Self-pay | Admitting: Cardiology
